# Patient Record
Sex: Female | Born: 1995 | Race: Black or African American | Hispanic: No | Marital: Single | Smoking: Former smoker
Health system: Southern US, Community
[De-identification: ages and names within clinical notes are randomized; demographics above are authoritative.]

## PROBLEM LIST (undated history)

## (undated) ENCOUNTER — Inpatient Hospital Stay (HOSPITAL_COMMUNITY): Payer: Self-pay

## (undated) DIAGNOSIS — J45909 Unspecified asthma, uncomplicated: Secondary | ICD-10-CM

## (undated) DIAGNOSIS — O139 Gestational [pregnancy-induced] hypertension without significant proteinuria, unspecified trimester: Secondary | ICD-10-CM

## (undated) DIAGNOSIS — B999 Unspecified infectious disease: Secondary | ICD-10-CM

## (undated) HISTORY — PX: EYE SURGERY: SHX253

---
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
## 2016-08-14 ENCOUNTER — Encounter (HOSPITAL_COMMUNITY): Payer: Self-pay | Admitting: *Deleted

## 2016-08-14 ENCOUNTER — Inpatient Hospital Stay (HOSPITAL_COMMUNITY)
Admission: AD | Admit: 2016-08-14 | Discharge: 2016-08-14 | Disposition: A | Discharge status: Home / Self Care | Payer: Medicaid Other | Source: Ambulatory Visit | Attending: Obstetrics & Gynecology | Admitting: Obstetrics & Gynecology

## 2016-08-14 DIAGNOSIS — R109 Unspecified abdominal pain: Secondary | ICD-10-CM | POA: Insufficient documentation

## 2016-08-14 DIAGNOSIS — Z3202 Encounter for pregnancy test, result negative: Secondary | ICD-10-CM | POA: Diagnosis not present

## 2016-08-14 DIAGNOSIS — N3000 Acute cystitis without hematuria: Secondary | ICD-10-CM | POA: Insufficient documentation

## 2016-08-14 LAB — POCT PREGNANCY, URINE: Preg Test, Ur: NEGATIVE

## 2016-08-14 LAB — URINALYSIS, ROUTINE W REFLEX MICROSCOPIC
Bilirubin Urine: NEGATIVE
GLUCOSE, UA: NEGATIVE mg/dL
Ketones, ur: NEGATIVE mg/dL
NITRITE: NEGATIVE
PH: 5 (ref 5.0–8.0)
Protein, ur: 30 mg/dL — AB
SPECIFIC GRAVITY, URINE: 1.032 — AB (ref 1.005–1.030)

## 2016-08-14 MED ORDER — CIPROFLOXACIN HCL 500 MG PO TABS: 500.0000 mg | ORAL_TABLET | Freq: Two times a day (BID) | ORAL | 0 refills | Status: DC

## 2016-08-14 NOTE — MAU Provider Note (Signed)
[facility] Attending Note  History     CSN: [id number]  Arrival date & time 08/14/16  1621   None     Chief Complaint  Patient presents with  . Abdominal Pain  . Urinary Frequency  . Headache    Sylvia Chambers is a 21 y.o. G0 who presents to MAU today for evaluation of UTI symptoms. Reports dysuria, frequent urination, suprapubic pain for the last 4 days. Associated with headache.  She is worried she could be pregnant, did not take home UPT.  Denies any abnormal vaginal discharge, fevers, chills, sweats, nausea, vomiting, other GI or GU symptoms or other general symptoms.   Past Medical History:  Diagnosis Date  . Asthma     No past surgical history on file.  No family history on file.  Social History  Substance Use Topics  . Smoking status: Never Smoker  . Smokeless tobacco: Not on file  . Alcohol use No    No Known Allergies  No prescriptions prior to admission.     Physical Exam  BP 137/85 (BP Location: Right Arm)   Pulse 104   Temp 97.7 F (36.5 C) (Oral)   Resp 18   Ht 5\' 3"  (1.6 m)   Wt 183 lb (83 kg)   LMP 07/08/2016   BMI 32.42 kg/m  GENERAL: Well-developed, well-nourished female in no acute distress  SKIN: Warm, dry and without erythema PSYCH: Normal mood and affect HEENT: Normocephalic, atraumatic.   LUNGS: Normal respiratory effort, normal breath sounds HEART: Regular rate noted ABDOMEN: Soft, mild suprapubic tenderness, no rebound, no guarding, nondistended PELVIC: Deferred EXTREMITIES: No edema, no cyanosis, normal range of movement  MAU Course/MDM  UA and UPT done.  Labs and Imaging   Results for orders placed or performed during the hospital encounter of 08/14/16 (from the past 24 hour(s))  Urinalysis, Routine w reflex microscopic     Status: Abnormal   Collection Time: 08/14/16  4:27 PM  Result Value Ref Range   Color, Urine YELLOW YELLOW   APPearance CLOUDY (A) CLEAR   Specific Gravity, Urine 1.032 (H)  1.005 - 1.030   pH 5.0 5.0 - 8.0   Glucose, UA NEGATIVE NEGATIVE mg/dL   Hgb urine dipstick SMALL (A) NEGATIVE   Bilirubin Urine NEGATIVE NEGATIVE   Ketones, ur NEGATIVE NEGATIVE mg/dL   Protein, ur 30 (A) NEGATIVE mg/dL   Nitrite NEGATIVE NEGATIVE   Leukocytes, UA LARGE (A) NEGATIVE   RBC / HPF 0-5 0 - 5 RBC/hpf   WBC, UA TOO NUMEROUS TO COUNT 0 - 5 WBC/hpf   Bacteria, UA RARE (A) NONE SEEN   Squamous Epithelial / LPF 6-30 (A) NONE SEEN   Mucous PRESENT   Pregnancy, urine POC     Status: None   Collection Time: 08/14/16  4:49 PM  Result Value Ref Range   Preg Test, Ur NEGATIVE NEGATIVE   No results found.  Assessment and Plan   1. Acute cystitis without hematuria    Ciprofloxacin ordered given symptoms, will follow up urine culture. She was reassured she was not pregnant, encouraged to go to [facility] to get contraception and to use condoms 100% of the time to prevent STIs.  Was told to return to MAU for any pain, bleeding or other concerns, or if her condition were to change or worsen. Discharged to home in stable condition    Allergies as of 08/14/2016   No Known Allergies     Medication List  TAKE these medications   ciprofloxacin 500 MG tablet Commonly known as:  CIPRO Take 1 tablet (500 mg total) by mouth 2 (two) times daily.   ibuprofen 800 MG tablet Commonly known as:  ADVIL,MOTRIN Take 1 tablet (800 mg total) by mouth 3 (three) times daily.        Brittan Butterbaugh, MD, FACOG Attending Obstetrician & Gynecologist, [facility], [facility]

## 2016-08-14 NOTE — MAU Note (Signed)
Pt C/O lower abd cramping for the last 4 days, also frequent urination.  Took HPT but no lines appeared.  Denies vaginal bleeding.  Also C/O HA.

## 2016-08-14 NOTE — Discharge Instructions (Signed)

## 2017-03-05 ENCOUNTER — Inpatient Hospital Stay (HOSPITAL_COMMUNITY): Payer: Medicaid Other

## 2017-03-05 ENCOUNTER — Inpatient Hospital Stay (HOSPITAL_COMMUNITY)
Admission: AD | Admit: 2017-03-05 | Discharge: 2017-03-05 | Disposition: A | Discharge status: Home / Self Care | Payer: Medicaid Other | Source: Ambulatory Visit | Attending: Obstetrics & Gynecology | Admitting: Obstetrics & Gynecology

## 2017-03-05 ENCOUNTER — Encounter (HOSPITAL_COMMUNITY): Payer: Self-pay | Admitting: *Deleted

## 2017-03-05 DIAGNOSIS — O99511 Diseases of the respiratory system complicating pregnancy, first trimester: Secondary | ICD-10-CM | POA: Diagnosis not present

## 2017-03-05 DIAGNOSIS — O23591 Infection of other part of genital tract in pregnancy, first trimester: Secondary | ICD-10-CM | POA: Insufficient documentation

## 2017-03-05 DIAGNOSIS — O26899 Other specified pregnancy related conditions, unspecified trimester: Secondary | ICD-10-CM

## 2017-03-05 DIAGNOSIS — R109 Unspecified abdominal pain: Secondary | ICD-10-CM | POA: Insufficient documentation

## 2017-03-05 DIAGNOSIS — R1030 Lower abdominal pain, unspecified: Secondary | ICD-10-CM | POA: Diagnosis present

## 2017-03-05 DIAGNOSIS — Z3A01 Less than 8 weeks gestation of pregnancy: Secondary | ICD-10-CM | POA: Insufficient documentation

## 2017-03-05 DIAGNOSIS — Z79899 Other long term (current) drug therapy: Secondary | ICD-10-CM | POA: Insufficient documentation

## 2017-03-05 DIAGNOSIS — O26891 Other specified pregnancy related conditions, first trimester: Secondary | ICD-10-CM | POA: Insufficient documentation

## 2017-03-05 DIAGNOSIS — B9689 Other specified bacterial agents as the cause of diseases classified elsewhere: Secondary | ICD-10-CM

## 2017-03-05 DIAGNOSIS — N760 Acute vaginitis: Secondary | ICD-10-CM | POA: Insufficient documentation

## 2017-03-05 DIAGNOSIS — J45909 Unspecified asthma, uncomplicated: Secondary | ICD-10-CM | POA: Diagnosis not present

## 2017-03-05 DIAGNOSIS — O219 Vomiting of pregnancy, unspecified: Secondary | ICD-10-CM | POA: Insufficient documentation

## 2017-03-05 DIAGNOSIS — Z3491 Encounter for supervision of normal pregnancy, unspecified, first trimester: Secondary | ICD-10-CM

## 2017-03-05 LAB — WET PREP, GENITAL
Sperm: NONE SEEN
Trich, Wet Prep: NONE SEEN
Yeast Wet Prep HPF POC: NONE SEEN

## 2017-03-05 LAB — CBC
HCT: 35.3 % — ABNORMAL LOW (ref 36.0–46.0)
HEMOGLOBIN: 12.2 g/dL (ref 12.0–15.0)
MCH: 29.7 pg (ref 26.0–34.0)
MCHC: 34.6 g/dL (ref 30.0–36.0)
MCV: 85.9 fL (ref 78.0–100.0)
Platelets: 279 10*3/uL (ref 150–400)
RBC: 4.11 MIL/uL (ref 3.87–5.11)
RDW: 12.2 % (ref 11.5–15.5)
WBC: 8 10*3/uL (ref 4.0–10.5)

## 2017-03-05 LAB — URINALYSIS, ROUTINE W REFLEX MICROSCOPIC
Bilirubin Urine: NEGATIVE
Glucose, UA: NEGATIVE mg/dL
HGB URINE DIPSTICK: NEGATIVE
KETONES UR: NEGATIVE mg/dL
NITRITE: NEGATIVE
Protein, ur: NEGATIVE mg/dL
Specific Gravity, Urine: 1.025 (ref 1.005–1.030)
pH: 5 (ref 5.0–8.0)

## 2017-03-05 LAB — HCG, QUANTITATIVE, PREGNANCY: hCG, Beta Chain, Quant, S: 15628 m[IU]/mL — ABNORMAL HIGH (ref <5)

## 2017-03-05 LAB — POCT PREGNANCY, URINE: Preg Test, Ur: POSITIVE — AB

## 2017-03-05 MED ORDER — PROMETHAZINE HCL 25 MG PO TABS: 25.0000 mg | ORAL_TABLET | Freq: Four times a day (QID) | ORAL | 0 refills | Status: DC | PRN

## 2017-03-05 MED ORDER — METRONIDAZOLE 500 MG PO TABS: 500.0000 mg | ORAL_TABLET | Freq: Two times a day (BID) | ORAL | 0 refills | Status: DC

## 2017-03-05 NOTE — MAU Note (Addendum)
Pt presents to MAU c/o lower abdominal pain that started a week ago. Pt states the pain is a 8 out of 10 and explains it as a cramping/stretching pain. Pt states she has been having bad headaches and has had some nausea and vomiting. Pt states she had some coughing  Earlier today and it has now subsided. Pt denies bleeding or vaginal discharge.

## 2017-03-05 NOTE — Discharge Instructions (Signed)
[facility] ° ° °[facility]       Phone: [phone] ° °[facility] Phone: [phone] ° °[facility]  Phone: [phone] ° °[facility] [place]  Phone: [phone] ° °[facility] [place]  Phone: [phone] ° °[facility]       Phone: [phone] ° °[facility]    Phone: [phone]  ° °[facility]    Phone: [phone] ° °[facility]    Phone: [phone] ° °[facility]    Phone: [phone]  ° °[facility]  Phone: [phone] ° °[facility]    Phone: [phone] ° °[facility]    Phone: [phone] ° °[facility]    Phone: [phone] ° ° ° °Bacterial Vaginosis °Bacterial vaginosis is a vaginal infection that occurs when the normal balance of bacteria in the vagina is disrupted. It results from an overgrowth of certain bacteria. This is the most common vaginal infection among women ages 15-44. °Because bacterial vaginosis increases your risk for STIs (sexually transmitted infections), getting treated can help reduce your risk for chlamydia, gonorrhea, herpes, and HIV (human immunodeficiency virus). Treatment is also important for preventing complications in pregnant women, because this condition can cause an early (premature) delivery. °What are the causes? °This condition is caused by an increase in harmful bacteria that are normally present in small amounts in the vagina. However, the reason that the condition develops is not fully understood. °What increases the risk? °The following factors may make you more likely to develop this condition: °· Having a new sexual partner or multiple sexual partners. °· Having unprotected  sex. °· Douching. °· Having an intrauterine device (IUD). °· Smoking. °· Drug and alcohol abuse. °· Taking certain antibiotic medicines. °· Being pregnant. °You cannot get bacterial vaginosis from toilet seats, bedding, swimming pools, or contact with objects around you. °What are the signs or symptoms? °Symptoms of this condition include: °· Grey or white vaginal discharge. The discharge can also be watery or foamy. °· A fish-like odor with discharge, especially after sexual intercourse or during menstruation. °· Itching in and around the vagina. °· Burning or pain with urination. °Some women with bacterial vaginosis have no signs or symptoms. °How is this diagnosed? °This condition is diagnosed based on: °· Your medical history. °· A physical exam of the vagina. °· Testing a sample of vaginal fluid under a microscope to look for a large amount of bad bacteria or abnormal cells. Your health care provider may use a cotton swab or a small wooden spatula to collect the sample. °How is this treated? °This condition is treated with antibiotics. These may be given as a pill, a vaginal cream, or a medicine that is put into the vagina (suppository). If the condition comes back after treatment, a second round of antibiotics may be needed. °Follow these instructions at home: °Medicines  °· Take over-the-counter and prescription medicines only as told by your health care provider. °· Take or use your antibiotic as told by your health care provider. Do not stop taking or using the antibiotic even if you start to feel better. °General instructions  °· If you have a female sexual partner, tell her that you have a vaginal infection. She should see her health care provider and be treated if she has symptoms.   If you have a female sexual partner, he does not need treatment. °· During treatment: °¨ Avoid sexual activity until you finish treatment. °¨ Do not douche. °¨ Avoid alcohol as directed by your health care provider. °¨ Avoid  breastfeeding as directed by your health care provider. °· Drink enough water and fluids to keep your urine clear or pale yellow. °· Keep the area around your vagina and rectum clean. °¨ Wash the area daily with warm water. °¨ Wipe yourself from front to back after using the toilet. °· Keep all follow-up visits as told by your health care provider. This is important. °How is this prevented? °· Do not douche. °· Wash the outside of your vagina with warm water only. °· Use protection when having sex. This includes latex condoms and dental dams. °· Limit how many sexual partners you have. To help prevent bacterial vaginosis, it is best to have sex with just one partner (monogamous). °· Make sure you and your sexual partner are tested for STIs. °· Wear cotton or cotton-lined underwear. °· Avoid wearing tight pants and pantyhose, especially during summer. °· Limit the amount of alcohol that you drink. °· Do not use any products that contain nicotine or tobacco, such as cigarettes and e-cigarettes. If you need help quitting, ask your health care provider. °· Do not use illegal drugs. °Where to find more information: °· Centers for Disease Control and Prevention: www.cdc.gov/std °· American Sexual Health Association (ASHA): www.ashastd.org °· U.S. Department of Health and Human Services, Office on Women's Health: www.womenshealth.gov/ or https://www.womenshealth.gov/a-z-topics/bacterial-vaginosis °Contact a health care provider if: °· Your symptoms do not improve, even after treatment. °· You have more discharge or pain when urinating. °· You have a fever. °· You have pain in your abdomen. °· You have pain during sex. °· You have vaginal bleeding between periods. °Summary °· Bacterial vaginosis is a vaginal infection that occurs when the normal balance of bacteria in the vagina is disrupted. °· Because bacterial vaginosis increases your risk for STIs (sexually transmitted infections), getting treated can help reduce your  risk for chlamydia, gonorrhea, herpes, and HIV (human immunodeficiency virus). Treatment is also important for preventing complications in pregnant women, because the condition can cause an early (premature) delivery. °· This condition is treated with antibiotic medicines. These may be given as a pill, a vaginal cream, or a medicine that is put into the vagina (suppository). °This information is not intended to replace advice given to you by your health care provider. Make sure you discuss any questions you have with your health care provider. °Document Released: 05/20/2005 Document Revised: 02/03/2016 Document Reviewed: 02/03/2016 °Elsevier Interactive Patient Education © 2017 Elsevier Inc. ° °

## 2017-03-05 NOTE — MAU Provider Note (Signed)
History     CSN: [id number]  Arrival date and time: 03/05/17 2035  First Provider Initiated Contact with Patient 03/05/17 2123      Chief Complaint  Patient presents with  . Abdominal Pain   HPI Sylvia Chambers is a 21 y.o. G1P0 at [redacted]w[redacted]d by LMP who presents with abdominal pain. Symptoms began on Monday. Reports intermittent lower abdominal pain that she rates 8/10 & describes as cramping. Has taken ibuprofen without relief. Endorses some nausea, no vomiting today. Denies dysuria, vaginal discharge, fever/chills, diarrhea/constipation, or vaginal bleeding. LMP was 01/27/17. Did not take pregnancy test at home.   OB History    Gravida Para Term Preterm AB Living   1             SAB TAB Ectopic Multiple Live Births                  Past Medical History:  Diagnosis Date  . Asthma     No past surgical history on file.  No family history on file.  Social History  Substance Use Topics  . Smoking status: Never Smoker  . Smokeless tobacco: Not on file  . Alcohol use No    Allergies: No Known Allergies  Prescriptions Prior to Admission  Medication Sig Dispense Refill Last Dose  . ciprofloxacin (CIPRO) 500 MG tablet Take 1 tablet (500 mg total) by mouth 2 (two) times daily. 6 tablet 0   . ibuprofen (ADVIL,MOTRIN) 800 MG tablet Take 1 tablet (800 mg total) by mouth 3 (three) times daily. 21 tablet 0 Unknown at Unknown time    Review of Systems  Constitutional: Negative.   Gastrointestinal: Positive for abdominal pain and nausea. Negative for constipation, diarrhea and vomiting.  Genitourinary: Negative.    Physical Exam   Blood pressure 129/83, pulse 97, temperature 99.6 F (37.6 C), temperature source Oral, resp. rate 17, height  (1.6 m), last menstrual period 01/27/2017.  Physical Exam  Nursing note and vitals reviewed. Constitutional: She is oriented to person, place, and time. She appears well-developed and well-nourished. No distress.  HENT:  Head:  Normocephalic and atraumatic.  Eyes: Conjunctivae are normal. Right eye exhibits no discharge. Left eye exhibits no discharge. No scleral icterus.  Neck: Normal range of motion.  Cardiovascular: Normal rate, regular rhythm and normal heart sounds.   No murmur heard. Respiratory: Effort normal and breath sounds normal. No respiratory distress. She has no wheezes.  GI: Soft. Bowel sounds are normal. She exhibits no distension. There is no tenderness. There is no rebound and no guarding.  Genitourinary: Uterus normal. Cervix exhibits no motion tenderness. Right adnexum displays no mass and no tenderness. Left adnexum displays no mass and no tenderness.  Genitourinary Comments: Cervix closed  Neurological: She is alert and oriented to person, place, and time.  Skin: Skin is warm and dry. She is not diaphoretic.  Psychiatric: She has a normal mood and affect. Her behavior is normal. Judgment and thought content normal.    MAU Course  Procedures Results for orders placed or performed during the hospital encounter of 03/05/17 (from the past 24 hour(s))  Urinalysis, Routine w reflex microscopic     Status: Abnormal   Collection Time: 03/05/17  8:59 PM  Result Value Ref Range   Color, Urine YELLOW YELLOW   APPearance HAZY (A) CLEAR   Specific Gravity, Urine 1.025 1.005 - 1.030   pH 5.0 5.0 - 8.0   Glucose, UA NEGATIVE NEGATIVE mg/dL   Hgb urine  dipstick NEGATIVE NEGATIVE   Bilirubin Urine NEGATIVE NEGATIVE   Ketones, ur NEGATIVE NEGATIVE mg/dL   Protein, ur NEGATIVE NEGATIVE mg/dL   Nitrite NEGATIVE NEGATIVE   Leukocytes, UA LARGE (A) NEGATIVE   RBC / HPF 0-5 0 - 5 RBC/hpf   WBC, UA 6-30 0 - 5 WBC/hpf   Bacteria, UA RARE (A) NONE SEEN   Squamous Epithelial / LPF 6-30 (A) NONE SEEN   Mucus PRESENT   Pregnancy, urine POC     Status: Abnormal   Collection Time: 03/05/17  9:05 PM  Result Value Ref Range   Preg Test, Ur POSITIVE (A) NEGATIVE  Wet prep, genital     Status: Abnormal    Collection Time: 03/05/17  9:31 PM  Result Value Ref Range   Yeast Wet Prep HPF POC NONE SEEN NONE SEEN   Trich, Wet Prep NONE SEEN NONE SEEN   Clue Cells Wet Prep HPF POC PRESENT (A) NONE SEEN   WBC, Wet Prep HPF POC MODERATE (A) NONE SEEN   Sperm NONE SEEN   CBC     Status: Abnormal   Collection Time: 03/05/17 10:00 PM  Result Value Ref Range   WBC 8.0 4.0 - 10.5 K/uL   RBC 4.11 3.87 - 5.11 MIL/uL   Hemoglobin 12.2 12.0 - 15.0 g/dL   HCT 09.8 (L) 11.9 - 14.7 %   MCV 85.9 78.0 - 100.0 fL   MCH 29.7 26.0 - 34.0 pg   MCHC 34.6 30.0 - 36.0 g/dL   RDW 82.9 56.2 - 13.0 %   Platelets 279 150 - 400 K/uL  ABO/Rh     Status: None (Preliminary result)   Collection Time: 03/05/17 10:00 PM  Result Value Ref Range   ABO/RH(D) B POS   hCG, quantitative, pregnancy     Status: Abnormal   Collection Time: 03/05/17 10:00 PM  Result Value Ref Range   hCG, Beta Chain, Quant, S 15,628 (H) <5 mIU/mL   US Ob Comp Less 14 Wks  Result Date: 03/05/2017 CLINICAL DATA:  Lower abdominal pain for 1 week. Positive urine pregnancy test. Gestational age by ultrasound 5 weeks and 2 days. EXAM: OBSTETRIC <14 WK [place] AND TRANSVAGINAL OB US TECHNIQUE: Both transabdominal and transvaginal ultrasound examinations were performed for complete evaluation of the gestation as well as the maternal uterus, adnexal regions, and pelvic cul-de-sac. Transvaginal technique was performed to assess early pregnancy. COMPARISON:  None. FINDINGS: Intrauterine gestational sac: Present Yolk sac:  Present Embryo:  Not press Cardiac Activity: Not present MSD: 12  mm   6 w   0  d Subchorionic hemorrhage:  None visualized. Maternal uterus/adnexae: 2.3 cm ovarian RIGHT corpus luteal cyst. Trace free fluid. IMPRESSION: Probable early intrauterine gestational and yolk sac, without fetal pole, or cardiac activity yet visualized. Recommend follow-up quantitative B-HCG levels and follow-up US in 14 days to assess viability. This recommendation follows  SRU consensus guidelines: Diagnostic Criteria for Nonviable Pregnancy Early in the First Trimester. Malva Limes Med 2013; 865:7846-96. Electronically Signed   By: Awilda Metro M.D.   On: 03/05/2017 22:24   US Ob Transvaginal  Result Date: 03/05/2017 CLINICAL DATA:  Lower abdominal pain for 1 week. Positive urine pregnancy test. Gestational age by ultrasound 5 weeks and 2 days. EXAM: OBSTETRIC <14 WK [place] AND TRANSVAGINAL OB US TECHNIQUE: Both transabdominal and transvaginal ultrasound examinations were performed for complete evaluation of the gestation as well as the maternal uterus, adnexal regions, and pelvic cul-de-sac. Transvaginal technique was performed to  assess early pregnancy. COMPARISON:  None. FINDINGS: Intrauterine gestational sac: Present Yolk sac:  Present Embryo:  Not press Cardiac Activity: Not present MSD: 12  mm   6 w   0  d Subchorionic hemorrhage:  None visualized. Maternal uterus/adnexae: 2.3 cm ovarian RIGHT corpus luteal cyst. Trace free fluid. IMPRESSION: Probable early intrauterine gestational and yolk sac, without fetal pole, or cardiac activity yet visualized. Recommend follow-up quantitative B-HCG levels and follow-up US in 14 days to assess viability. This recommendation follows SRU consensus guidelines: Diagnostic Criteria for Nonviable Pregnancy Early in the First Trimester. Malva Limes Med 2013; 161:0960-45. Electronically Signed   By: Awilda Metro M.D.   On: 03/05/2017 22:24     MDM +UPT UA, wet prep, GC/chlamydia, CBC, ABO/Rh, quant hCG, HIV, and [place] today to rule out ectopic pregnancy B positive Ultrasound shows IUGS with yolk sac Assessment and Plan  A: 1. Normal IUP (intrauterine pregnancy) on prenatal ultrasound, first trimester   2. Abdominal pain affecting pregnancy   3. Nausea and vomiting during pregnancy prior to [redacted] weeks gestation   4. BV (bacterial vaginosis)   5. Less than [redacted] weeks gestation of pregnancy    P: Discharge home Rx flagyl Discussed  reasons to return to MAU Start prenatal care GC/CT pending;  Judeth Horn 03/05/2017, 9:23 PM

## 2017-03-06 LAB — ABO/RH: ABO/RH(D): B POS

## 2017-03-06 LAB — GC/CHLAMYDIA PROBE AMP (~~LOC~~) NOT AT ARMC
Chlamydia: NEGATIVE
Neisseria Gonorrhea: NEGATIVE

## 2017-03-28 ENCOUNTER — Inpatient Hospital Stay (HOSPITAL_COMMUNITY)
Admission: AD | Admit: 2017-03-28 | Discharge: 2017-03-28 | Disposition: A | Discharge status: Home / Self Care | Payer: Medicaid Other | Source: Ambulatory Visit | Attending: Obstetrics & Gynecology | Admitting: Obstetrics & Gynecology

## 2017-03-28 ENCOUNTER — Encounter (HOSPITAL_COMMUNITY): Payer: Self-pay | Admitting: *Deleted

## 2017-03-28 ENCOUNTER — Inpatient Hospital Stay (HOSPITAL_COMMUNITY): Payer: Medicaid Other

## 2017-03-28 DIAGNOSIS — R109 Unspecified abdominal pain: Secondary | ICD-10-CM

## 2017-03-28 DIAGNOSIS — O219 Vomiting of pregnancy, unspecified: Secondary | ICD-10-CM | POA: Diagnosis not present

## 2017-03-28 DIAGNOSIS — E860 Dehydration: Secondary | ICD-10-CM | POA: Diagnosis not present

## 2017-03-28 DIAGNOSIS — Z3A08 8 weeks gestation of pregnancy: Secondary | ICD-10-CM | POA: Diagnosis not present

## 2017-03-28 DIAGNOSIS — O26891 Other specified pregnancy related conditions, first trimester: Secondary | ICD-10-CM | POA: Diagnosis not present

## 2017-03-28 DIAGNOSIS — R51 Headache: Secondary | ICD-10-CM | POA: Diagnosis not present

## 2017-03-28 DIAGNOSIS — G44209 Tension-type headache, unspecified, not intractable: Secondary | ICD-10-CM

## 2017-03-28 DIAGNOSIS — O26899 Other specified pregnancy related conditions, unspecified trimester: Secondary | ICD-10-CM

## 2017-03-28 LAB — URINALYSIS, ROUTINE W REFLEX MICROSCOPIC
BILIRUBIN URINE: NEGATIVE
GLUCOSE, UA: NEGATIVE mg/dL
Hgb urine dipstick: NEGATIVE
KETONES UR: NEGATIVE mg/dL
NITRITE: NEGATIVE
PH: 5 (ref 5.0–8.0)
Protein, ur: 30 mg/dL — AB
SPECIFIC GRAVITY, URINE: 1.028 (ref 1.005–1.030)

## 2017-03-28 MED ORDER — DIPHENHYDRAMINE HCL 50 MG/ML IJ SOLN
25.0000 mg | Freq: Once | INTRAMUSCULAR | Status: AC
  Administered 2017-03-28: 25 mg via INTRAVENOUS
  Filled 2017-03-28: qty 1

## 2017-03-28 MED ORDER — METOCLOPRAMIDE HCL 5 MG/ML IJ SOLN
10.0000 mg | Freq: Once | INTRAMUSCULAR | Status: AC
  Administered 2017-03-28: 10 mg via INTRAVENOUS
  Filled 2017-03-28: qty 2

## 2017-03-28 MED ORDER — DEXAMETHASONE SODIUM PHOSPHATE 10 MG/ML IJ SOLN
10.0000 mg | Freq: Once | INTRAMUSCULAR | Status: AC
  Administered 2017-03-28: 10 mg via INTRAVENOUS
  Filled 2017-03-28: qty 1

## 2017-03-28 MED ORDER — SODIUM CHLORIDE 0.9 % IV BOLUS (SEPSIS)
1000.0000 mL | Freq: Once | INTRAVENOUS | Status: AC
  Administered 2017-03-28: 1000 mL via INTRAVENOUS

## 2017-03-28 MED ORDER — METOCLOPRAMIDE HCL 10 MG PO TABS: 10.0000 mg | ORAL_TABLET | Freq: Three times a day (TID) | ORAL | 0 refills | Status: DC | PRN

## 2017-03-28 NOTE — MAU Note (Addendum)
Having pain in lower abd for 3 wks and headaches same amt of time. Tylenol not helping headaches.Denies vag bleeding or d/c. Unable to keep down foods but able to keep down liqs. Phenergan not helping

## 2017-03-28 NOTE — Discharge Instructions (Signed)
Eating Plan for Nausea and Vomiting in pregnancy Hyperemesis gravidarum is a severe form of morning sickness. Because this condition causes severe nausea and vomiting, it can lead to dehydration, malnutrition, and weight loss. One way to lessen the symptoms of nausea and vomiting is to follow the eating plan for hyperemesis gravidarum. It is often used along with prescribed medicines to control your symptoms. What can I do to relieve my symptoms? Listen to your body. Everyone is different and has different preferences. Find what works best for you. Take any of the following actions that are helpful to you:  Eat and drink slowly.  Eat 5-6 small meals daily instead of 3 large meals.  Eat crackers before you get out of bed in the morning.  Try having a snack in the middle of the night.  Starchy foods are usually tolerated well. Examples include cereal, toast, bread, potatoes, pasta, rice, and pretzels.  Ginger may help with nausea. Add  tsp ground ginger to hot tea or choose ginger tea.  Try drinking 100% fruit juice or an electrolyte drink. An electrolyte drink contains sodium, potassium, and chloride.  Continue to take your prenatal vitamins as told by your health care provider. If you are having trouble taking your prenatal vitamins, talk with your health care provider about different options.  Include at least 1 serving of protein with your meals and snacks. Protein options include meats or poultry, beans, nuts, eggs, and yogurt. Try eating a protein-rich snack before bed. Examples of these snacks include cheese and crackers or half of a peanut butter or [place] sandwich.  Consider eliminating foods that trigger your symptoms. These may include spicy foods, coffee, high-fat foods, very sweet foods, and acidic foods.  Try meals that have more protein combined with bland, salty, lower-fat, and dry foods, such as nuts, seeds, pretzels, crackers, and cereal.  Talk with your healthcare  provider about starting a supplement of vitamin B6.  Have fluids that are cold, clear, and carbonated or sour. Examples include lemonade, ginger ale, lemon-lime soda, ice water, and sparkling water.  Try lemon or mint tea.  Try brushing your teeth or using a mouth rinse after meals.  What should I avoid to reduce my symptoms? Avoiding some of the following things may help reduce your symptoms.  Foods with strong smells. Try eating meals in well-ventilated areas that are free of odors.  Drinking water or other beverages with meals. Try not to drink anything during the 30 minutes before and after your meals.  Drinking more than 1 cup of fluid at a time. Sometimes using a straw helps.  Fried or high-fat foods, such as butter and cream sauces.  Spicy foods.  Skipping meals as best as you can. Nausea can be more intense on an empty stomach. If you cannot tolerate food at that time, do not force it. Try sucking on ice chips or other frozen items, and make up for missed calories later.  Lying down within 2 hours after eating.  Environmental triggers. These may include smoky rooms, closed spaces, rooms with strong smells, warm or humid places, overly loud and noisy rooms, and rooms with motion or flickering lights.  Quick and sudden changes in your movement.  This information is not intended to replace advice given to you by your health care provider. Make sure you discuss any questions you have with your health care provider. Document Released: 03/17/2007 Document Revised: 01/17/2016 Document Reviewed: 12/19/2015 Elsevier Interactive Patient Education  2018 [employer]. How  a Baby Grows During Pregnancy Pregnancy begins when a female's sperm enters a female's egg (fertilization). This happens in one of the tubes (fallopian tubes) that connect the ovaries to the womb (uterus). The fertilized egg is called an embryo until it reaches 10 weeks. From 10 weeks until birth, it is called a fetus.  The fertilized egg moves down the fallopian tube to the uterus. Then it implants into the lining of the uterus and begins to grow. The developing fetus receives oxygen and nutrients through the pregnant woman's bloodstream and the tissues that grow (placenta) to support the fetus. The placenta is the life support system for the fetus. It provides nutrition and removes waste. Learning as much as you can about your pregnancy and how your baby is developing can help you enjoy the experience. It can also make you aware of when there might be a problem and when to ask questions. How long does a typical pregnancy last? A pregnancy usually lasts 280 days, or about 40 weeks. Pregnancy is divided into three trimesters:  First trimester: 0-13 weeks.  Second trimester: 14-27 weeks.  Third trimester: 28-40 weeks.  The day when your baby is considered ready to be born (full term) is your estimated date of delivery. How does my baby develop month by month? First month  The fertilized egg attaches to the inside of the uterus.  Some cells will form the placenta. Others will form the fetus.  The arms, legs, brain, spinal cord, lungs, and heart begin to develop.  At the end of the first month, the heart begins to beat.  Second month  The bones, inner ear, eyelids, hands, and feet form.  The genitals develop.  By the end of 8 weeks, all major organs are developing.  Third month  All of the internal organs are forming.  Teeth develop below the gums.  Bones and muscles begin to grow. The spine can flex.  The skin is transparent.  Fingernails and toenails begin to form.  Arms and legs continue to grow longer, and hands and feet develop.  The fetus is about 3 in (7.6 cm) long.  Fourth month  The placenta is completely formed.  The external sex organs, neck, outer ear, eyebrows, eyelids, and fingernails are formed.  The fetus can hear, swallow, and move its arms and legs.  The kidneys  begin to produce urine.  The skin is covered with a white waxy coating (vernix) and very fine hair (lanugo).  Fifth month  The fetus moves around more and can be felt for the first time (quickening).  The fetus starts to sleep and wake up and may begin to suck its finger.  The nails grow to the end of the fingers.  The organ in the digestive system that makes bile (gallbladder) functions and helps to digest the nutrients.  If your baby is a girl, eggs are present in her ovaries. If your baby is a boy, testicles start to move down into his scrotum.  Sixth month  The lungs are formed, but the fetus is not yet able to breathe.  The eyes open. The brain continues to develop.  Your baby has fingerprints and toe prints. Your baby's hair grows thicker.  At the end of the second trimester, the fetus is about 9 in (22.9 cm) long.  Seventh month  The fetus kicks and stretches.  The eyes are developed enough to sense changes in light.  The hands can make a grasping motion.  The  fetus responds to sound.  Eighth month  All organs and body systems are fully developed and functioning.  Bones harden and taste buds develop. The fetus may hiccup.  Certain areas of the brain are still developing. The skull remains soft.  Ninth month  The fetus gains about  lb (0.23 kg) each week.  The lungs are fully developed.  Patterns of sleep develop.  The fetus's head typically moves into a head-down position (vertex) in the uterus to prepare for birth. If the buttocks move into a vertex position instead, the baby is breech.  The fetus weighs 6-9 lbs (2.72-4.08 kg) and is 19-20 in (48.26-50.8 cm) long.  What can I do to have a healthy pregnancy and help my baby develop? Eating and Drinking  Eat a healthy diet. ? Talk with your health care provider to make sure that you are getting the nutrients that you and your baby need. ? Visit www.[employer].be to learn about creating a healthy  diet.  Gain a healthy amount of weight during pregnancy as advised by your health care provider. This is usually 25-35 pounds. You may need to: ? Gain more if you were underweight before getting pregnant or if you are pregnant with more than one baby. ? Gain less if you were overweight or obese when you got pregnant.  Medicines and Vitamins  Take prenatal vitamins as directed by your health care provider. These include vitamins such as folic acid, iron, calcium, and vitamin D. They are important for healthy development.  Take medicines only as directed by your health care provider. Read labels and ask a pharmacist or your health care provider whether over-the-counter medicines, supplements, and prescription drugs are safe to take during pregnancy.  Activities  Be physically active as advised by your health care provider. Ask your health care provider to recommend activities that are safe for you to do, such as walking or swimming.  Do not participate in strenuous or extreme sports.  Lifestyle  Do not drink alcohol.  Do not use any tobacco products, including cigarettes, chewing tobacco, or electronic cigarettes. If you need help quitting, ask your health care provider.  Do not use illegal drugs.  Safety  Avoid exposure to mercury, lead, or other heavy metals. Ask your health care provider about common sources of these heavy metals.  Avoid listeria infection during pregnancy. Follow these precautions: ? Do not eat soft cheeses or deli meats. ? Do not eat hot dogs unless they have been warmed up to the point of steaming, such as in the microwave oven. ? Do not drink unpasteurized milk.  Avoid toxoplasmosis infection during pregnancy. Follow these precautions: ? Do not change your cat's litter box, if you have a cat. Ask someone else to do this for you. ? Wear gardening gloves while working in the yard.  General Instructions  Keep all follow-up visits as directed by your health  care provider. This is important. This includes prenatal care and screening tests.  Manage any chronic health conditions. Work closely with your health care provider to keep conditions, such as diabetes, under control.  How do I know if my baby is developing well? At each prenatal visit, your health care provider will do several different tests to check on your health and keep track of your babys development. These include:  Fundal height. ? Your health care provider will measure your growing belly from top to bottom using a tape measure. ? Your health care provider will also feel your belly  to determine your baby's position. °· Heartbeat. °? An ultrasound in the first trimester can confirm pregnancy and show a heartbeat, depending on how far along you are. °? Your health care provider will check your baby's heart rate at every prenatal visit. °? As you get closer to your delivery date, you may have regular fetal heart rate monitoring to make sure that your baby is not in distress. °· Second trimester ultrasound. °? This ultrasound checks your baby's development. It also indicates your baby’s gender. ° °What should I do if I have concerns about my baby's development? °Always talk with your health care provider about any concerns that you may have. °This information is not intended to replace advice given to you by your health care provider. Make sure you discuss any questions you have with your health care provider. °Document Released: 11/06/2007 Document Revised: 10/26/2015 Document Reviewed: 10/27/2013 °Elsevier Interactive Patient Education © 2018 Elsevier Inc. ° °

## 2017-03-28 NOTE — MAU Provider Note (Signed)
History     CSN: [id number]  Arrival date and time: 03/28/17 [id number]   First Provider Initiated Contact with Patient 03/28/17 2005      Chief Complaint  Patient presents with  . Abdominal Pain  . Headache   HPI Ms. Sylvia Rockne MenghiniJ Galindez is a 21 y.o. G1P0 at 6522w4d who presents to MAU today with complaint of N/V, abdominal pain and headache. The patient was seen in MAU ~ 3 weeks ago. She was given Phenergan. She has been taking that as needed, but doesn't feel it is working. She has not had any vomiting today. She has been able to eat and drink today. She did have multiple episodes of emesis yesterday. She has had a headache x 3 weeks. She rates her pain at 10/10 now. She has also been having lower abdominal cramping for the last few weeks. She rates this pain at 10/10 as well. She denies vaginal bleeding, discharge, UTI symptoms or fever. She has had watery stools at times, but no stools today. She was treated for BV at her last visit and completed the course.     OB History    Gravida Para Term Preterm AB Living   1             SAB TAB Ectopic Multiple Live Births                  Past Medical History:  Diagnosis Date  . Asthma     History reviewed. No pertinent surgical history.  History reviewed. No pertinent family history.  Social History  Substance Use Topics  . Smoking status: Never Smoker  . Smokeless tobacco: Never Used  . Alcohol use No    Allergies: No Known Allergies  No prescriptions prior to admission.    Review of Systems  Constitutional: Negative for fever.  Gastrointestinal: Positive for abdominal pain, nausea and vomiting. Negative for constipation and diarrhea.  Genitourinary: Negative for dysuria, frequency, urgency, vaginal bleeding and vaginal discharge.   Physical Exam   Blood pressure 134/75, pulse 77, temperature 98.4 F (36.9 C), resp. rate 18, height 5\' 3"  (1.6 m), weight 169 lb (76.7 kg), last menstrual period 01/27/2017.  Physical Exam   Nursing note and vitals reviewed. Constitutional: She is oriented to person, place, and time. She appears well-developed and well-nourished. No distress.  Appears comfortable and is on her phone and laughing with a friend  HENT:  Head: Normocephalic and atraumatic.  Cardiovascular: Normal rate, regular rhythm and normal heart sounds.   Respiratory: Effort normal and breath sounds normal. No respiratory distress.  GI: Soft. Bowel sounds are normal. She exhibits no distension and no mass. There is no tenderness. There is no rebound and no guarding.  Neurological: She is alert and oriented to person, place, and time.  Skin: Skin is warm and dry. No erythema.  Psychiatric: She has a normal mood and affect.    Results for orders placed or performed during the hospital encounter of 03/28/17 (from the past 24 hour(s))  Urinalysis, Routine w reflex microscopic     Status: Abnormal   Collection Time: 03/28/17  7:34 PM  Result Value Ref Range   Color, Urine YELLOW YELLOW   APPearance CLOUDY (A) CLEAR   Specific Gravity, Urine 1.028 1.005 - 1.030   pH 5.0 5.0 - 8.0   Glucose, UA NEGATIVE NEGATIVE mg/dL   Hgb urine dipstick NEGATIVE NEGATIVE   Bilirubin Urine NEGATIVE NEGATIVE   Ketones, ur NEGATIVE NEGATIVE mg/dL  Protein, ur 30 (A) NEGATIVE mg/dL   Nitrite NEGATIVE NEGATIVE   Leukocytes, UA LARGE (A) NEGATIVE   RBC / HPF 0-5 0 - 5 RBC/hpf   WBC, UA TOO NUMEROUS TO COUNT 0 - 5 WBC/hpf   Bacteria, UA RARE (A) NONE SEEN   Squamous Epithelial / LPF 6-30 (A) NONE SEEN   Mucus PRESENT    US Ob Transvaginal  Result Date: 03/28/2017 CLINICAL DATA:  Lower abdominal pain 3 weeks. Follow-up from 03/05/2017. Estimated gestational age per LMP 8 weeks 5 days. Quantitative beta HCG not performed. EXAM: OBSTETRIC <14 WK [place] AND TRANSVAGINAL OB US TECHNIQUE: Both transabdominal and transvaginal ultrasound examinations were performed for complete evaluation of the gestation as well as the maternal uterus,  adnexal regions, and pelvic cul-de-sac. Transvaginal technique was performed to assess early pregnancy. COMPARISON:  03/05/2017 FINDINGS: Intrauterine gestational sac: Single visualized. Yolk sac:  Visualized. Embryo:  Visualized. Cardiac Activity: Visualized. Heart Rate: 177  bpm CRL:  22  mm   8 w   6 d                  [place] EDC: 11/01/2017 Subchorionic hemorrhage: Small subchorionic hemorrhage measuring 0.7 x 0.8 x 3.1 cm. Maternal uterus/adnexae: Ovaries within normal. No free pelvic fluid. IMPRESSION: Single live IUP with estimated gestational age [redacted] weeks 6 days. Small subchronic hemorrhage. Recommend follow-up ultrasound as clinically indicated. Electronically Signed   By: Elberta Fortis M.D.   On: 03/28/2017 21:34     MAU Course  Procedures Bedside US attempted, but unable to visualize IUP  MDM UA today  Mild dehydration noted IV fluids, Reglan, Decadron and Benadryl given for headache and nausea US OB transvaginal ordered - SIUP noted with normal FHR Patient reports resolution of headache and nausea at time of discharge.   Assessment and Plan  A: SIUP at [redacted]w[redacted]d Nausea and vomiting in pregnancy prior to [redacted] weeks gestation  Headache  Abdominal pain in pregnancy, first trimester  P: Discharge home Rx for Reglan given to patient  Patient may also continue Phenergan when not taking Reglan Continue Tylenol PRN for pain  First trimester precautions discussed Patient advised to follow-up with [facility] as planned to start prenatal care Patient may return to MAU as needed or if her condition were to change or worsen   Vonzella Nipple, PA-C 03/28/2017, 11:14 PM

## 2017-04-23 LAB — OB RESULTS CONSOLE HIV ANTIBODY (ROUTINE TESTING): HIV: NONREACTIVE

## 2017-04-23 LAB — OB RESULTS CONSOLE GC/CHLAMYDIA
Chlamydia: NEGATIVE
GC PROBE AMP, GENITAL: NEGATIVE

## 2017-04-23 LAB — OB RESULTS CONSOLE RUBELLA ANTIBODY, IGM: RUBELLA: IMMUNE

## 2017-04-23 LAB — OB RESULTS CONSOLE HEPATITIS B SURFACE ANTIGEN: HEP B S AG: NEGATIVE

## 2017-04-23 LAB — OB RESULTS CONSOLE RPR: RPR: NONREACTIVE

## 2017-06-03 NOTE — L&D Delivery Note (Signed)
Delivery Note At 4:43 PM a viable and healthy female was delivered via Vaginal, Spontaneous (Presentation: cephalic).  APGAR: 9, 9; weight  pending.   Placenta status: spont, intact.  Cord: 3V with the following complications: none.  Cord pH: n/a  Anesthesia:  Epidural Episiotomy:  n/a Lacerations: 1st degree vaginal and bilateral labial Suture Repair: 3.0 vicryl Est. Blood Loss (mL): 300  Mom to postpartum.  Baby to Couplet care / Skin to Skin.  Purcell Nails 10/17/2017, 5:06 PM

## 2017-09-18 ENCOUNTER — Ambulatory Visit (HOSPITAL_COMMUNITY)
Admission: EM | Admit: 2017-09-18 | Discharge: 2017-09-18 | Disposition: A | Discharge status: Home / Self Care | Payer: Medicaid Other | Attending: Family Medicine | Admitting: Family Medicine

## 2017-09-18 ENCOUNTER — Other Ambulatory Visit: Payer: Self-pay

## 2017-09-18 ENCOUNTER — Encounter (HOSPITAL_COMMUNITY): Payer: Self-pay | Admitting: Emergency Medicine

## 2017-09-18 DIAGNOSIS — H65191 Other acute nonsuppurative otitis media, right ear: Secondary | ICD-10-CM | POA: Diagnosis not present

## 2017-09-18 DIAGNOSIS — H66002 Acute suppurative otitis media without spontaneous rupture of ear drum, left ear: Secondary | ICD-10-CM

## 2017-09-18 MED ORDER — AMOXICILLIN 500 MG PO CAPS: 500.0000 mg | ORAL_CAPSULE | Freq: Three times a day (TID) | ORAL | 0 refills | Status: AC

## 2017-09-18 NOTE — ED Triage Notes (Signed)
Seen by provider

## 2017-09-18 NOTE — ED Provider Notes (Signed)
[facility]    CSN: [id number] Arrival date & time: 09/18/17  1641     History   Chief Complaint No chief complaint on file.   HPI Gailene Rockne MenghiniJ Rucci is a 22 y.o. female.   22 year old female, with history of asthma, currently approximately 8 months gestation, presenting today complaining of congestion and left ear pain.  Symptoms started about a week ago.  Have been gradually worsening since the onset.  Mother is also here being evaluated for the same symptoms.  Patient said no fever chills, sore throat, headache, neck pain or stiffness.  The history is provided by the patient.  Otalgia  Location:  Left Quality:  Aching Severity:  Mild Onset quality:  Gradual Duration:  1 week Progression:  Waxing and waning Chronicity:  New Context: recent URI   Context: not direct blow, not elevation change, not foreign body in ear and not loud noise   Relieved by:  Nothing Worsened by:  Nothing Ineffective treatments:  None tried Associated symptoms: congestion and cough   Associated symptoms: no abdominal pain, no diarrhea, no ear discharge, no fever, no headaches, no hearing loss, no neck pain, no rash, no rhinorrhea, no sore throat, no tinnitus and no vomiting   Risk factors: no recent travel, no chronic ear infection and no prior ear surgery     Past Medical History:  Diagnosis Date  . Asthma     There are no active problems to display for this patient.   No past surgical history on file.  OB History    Gravida  1   Para      Term      Preterm      AB      Living        SAB      TAB      Ectopic      Multiple      Live Births               Home Medications    Prior to Admission medications   Medication Sig Start Date End Date Taking? Authorizing Provider  amoxicillin (AMOXIL) 500 MG capsule Take 1 capsule (500 mg total) by mouth 3 (three) times daily for 10 days. 09/18/17 09/28/17  Kadija Cruzen C, PA-C  metoCLOPramide (REGLAN) 10 MG  tablet Take 1 tablet (10 mg total) by mouth every 8 (eight) hours as needed for nausea. 03/28/17   Marny LowensteinWenzel, Julie N, PA-C  promethazine (PHENERGAN) 25 MG tablet Take 1 tablet (25 mg total) by mouth every 6 (six) hours as needed for nausea or vomiting. 03/05/17   Judeth HornLawrence, Erin, NP    Family History No family history on file.  Social History Social History   Tobacco Use  . Smoking status: Never Smoker  . Smokeless tobacco: Never Used  Substance Use Topics  . Alcohol use: No  . Drug use: Not on file     Allergies   Patient has no known allergies.   Review of Systems Review of Systems  Constitutional: Negative for chills and fever.  HENT: Positive for congestion and ear pain. Negative for ear discharge, hearing loss, rhinorrhea, sore throat and tinnitus.   Eyes: Negative for pain and visual disturbance.  Respiratory: Positive for cough. Negative for shortness of breath.   Cardiovascular: Negative for chest pain and palpitations.  Gastrointestinal: Negative for abdominal pain, diarrhea and vomiting.  Genitourinary: Negative for dysuria and hematuria.  Musculoskeletal: Negative for arthralgias, back pain and neck  pain.  Skin: Negative for color change and rash.  Neurological: Negative for seizures, syncope and headaches.  All other systems reviewed and are negative.    Physical Exam Triage Vital Signs ED Triage Vitals  Enc Vitals Group     BP      Pulse      Resp      Temp      Temp src      SpO2      Weight      Height      Head Circumference      Peak Flow      Pain Score      Pain Loc      Pain Edu?      Excl. in GC?    No data found.  Updated Vital Signs LMP 01/27/2017 (Exact Date)   Visual Acuity Right Eye Distance:   Left Eye Distance:   Bilateral Distance:    Right Eye Near:   Left Eye Near:    Bilateral Near:     Physical Exam  Constitutional: She appears well-developed and well-nourished. No distress.  HENT:  Head: Normocephalic and  atraumatic.  Right Ear: Hearing, external ear and ear canal normal. A middle ear effusion is present.  Left Ear: Hearing, external ear and ear canal normal. Tympanic membrane is erythematous and bulging.  Nose: Nose normal.  Mouth/Throat: Oropharynx is clear and moist. No oropharyngeal exudate, posterior oropharyngeal edema, posterior oropharyngeal erythema or tonsillar abscesses.  Eyes: Conjunctivae are normal.  Neck: Neck supple.  Cardiovascular: Normal rate and regular rhythm.  No murmur heard. Pulmonary/Chest: Effort normal and breath sounds normal. No stridor. No respiratory distress. She has no wheezes. She has no rhonchi. She has no rales.  Abdominal: Soft. There is no tenderness.  Musculoskeletal: She exhibits no edema.  Neurological: She is alert.  Skin: Skin is warm and dry.  Psychiatric: She has a normal mood and affect.  Nursing note and vitals reviewed.    UC Treatments / Results  Labs (all labs ordered are listed, but only abnormal results are displayed) Labs Reviewed - No data to display  EKG None Radiology No results found.  Procedures Procedures (including critical care time)  Medications Ordered in UC Medications - No data to display   Initial Impression / Assessment and Plan / UC Course  I have reviewed the triage vital signs and the nursing notes.  Pertinent labs & imaging results that were available during my care of the patient were reviewed by me and considered in my medical decision making (see chart for details).     Patient with right middle ear effusion, left otitis media.  Will treat with amoxicillin  Final Clinical Impressions(s) / UC Diagnoses   Final diagnoses:  Non-recurrent acute suppurative otitis media of left ear without spontaneous rupture of tympanic membrane  Acute middle ear effusion, right    ED Discharge Orders        Ordered    amoxicillin (AMOXIL) 500 MG capsule  3 times daily     09/18/17 1712       Controlled  Substance Prescriptions [place] Controlled Substance Registry consulted? Not Applicable   Alecia Lemming, [place] 09/18/17 1713

## 2017-09-29 LAB — OB RESULTS CONSOLE GBS: STREP GROUP B AG: NEGATIVE

## 2017-10-16 ENCOUNTER — Other Ambulatory Visit: Payer: Self-pay

## 2017-10-16 ENCOUNTER — Encounter (HOSPITAL_COMMUNITY): Payer: Self-pay | Admitting: *Deleted

## 2017-10-16 ENCOUNTER — Inpatient Hospital Stay (HOSPITAL_COMMUNITY)
Admission: AD | Admit: 2017-10-16 | Discharge: 2017-10-19 | DRG: 807 | Disposition: A | Discharge status: Home / Self Care | Payer: Medicaid Other | Source: Ambulatory Visit | Attending: Obstetrics & Gynecology | Admitting: Obstetrics & Gynecology

## 2017-10-16 DIAGNOSIS — D649 Anemia, unspecified: Secondary | ICD-10-CM | POA: Diagnosis present

## 2017-10-16 DIAGNOSIS — Z3A37 37 weeks gestation of pregnancy: Secondary | ICD-10-CM

## 2017-10-16 DIAGNOSIS — O1404 Mild to moderate pre-eclampsia, complicating childbirth: Secondary | ICD-10-CM | POA: Diagnosis present

## 2017-10-16 DIAGNOSIS — O9902 Anemia complicating childbirth: Secondary | ICD-10-CM | POA: Diagnosis present

## 2017-10-16 DIAGNOSIS — O1490 Unspecified pre-eclampsia, unspecified trimester: Secondary | ICD-10-CM | POA: Diagnosis present

## 2017-10-16 LAB — URINALYSIS, ROUTINE W REFLEX MICROSCOPIC
Bilirubin Urine: NEGATIVE
Glucose, UA: NEGATIVE mg/dL
Ketones, ur: NEGATIVE mg/dL
Nitrite: NEGATIVE
Protein, ur: NEGATIVE mg/dL
Specific Gravity, Urine: 1.014 (ref 1.005–1.030)
pH: 6 (ref 5.0–8.0)

## 2017-10-16 LAB — CBC
HCT: 31.8 % — ABNORMAL LOW (ref 36.0–46.0)
HEMOGLOBIN: 10.9 g/dL — AB (ref 12.0–15.0)
MCH: 29.1 pg (ref 26.0–34.0)
MCHC: 34.3 g/dL (ref 30.0–36.0)
MCV: 84.8 fL (ref 78.0–100.0)
PLATELETS: 247 10*3/uL (ref 150–400)
RBC: 3.75 MIL/uL — ABNORMAL LOW (ref 3.87–5.11)
RDW: 12.4 % (ref 11.5–15.5)
WBC: 6.6 10*3/uL (ref 4.0–10.5)

## 2017-10-16 LAB — COMPREHENSIVE METABOLIC PANEL
ALK PHOS: 112 U/L (ref 38–126)
ALT: 7 U/L — AB (ref 14–54)
AST: 17 U/L (ref 15–41)
Albumin: 3.2 g/dL — ABNORMAL LOW (ref 3.5–5.0)
Anion gap: 9 (ref 5–15)
BUN: <5 mg/dL — ABNORMAL LOW (ref 6–20)
CALCIUM: 8.6 mg/dL — AB (ref 8.9–10.3)
CHLORIDE: 109 mmol/L (ref 101–111)
CO2: 19 mmol/L — AB (ref 22–32)
CREATININE: 0.53 mg/dL (ref 0.44–1.00)
GFR calc non Af Amer: >60 mL/min (ref >60)
GLUCOSE: 108 mg/dL — AB (ref 65–99)
Potassium: 3.2 mmol/L — ABNORMAL LOW (ref 3.5–5.1)
SODIUM: 137 mmol/L (ref 135–145)
Total Bilirubin: 0.2 mg/dL — ABNORMAL LOW (ref 0.3–1.2)
Total Protein: 7.1 g/dL (ref 6.5–8.1)

## 2017-10-16 LAB — URIC ACID: URIC ACID, SERUM: 5.1 mg/dL (ref 2.3–6.6)

## 2017-10-16 LAB — TYPE AND SCREEN
ABO/RH(D): B POS
Antibody Screen: NEGATIVE

## 2017-10-16 LAB — PROTEIN / CREATININE RATIO, URINE
Creatinine, Urine: 169 mg/dL
Protein Creatinine Ratio: 0.14 mg/mg{Cre} (ref 0.00–0.15)
TOTAL PROTEIN, URINE: 23 mg/dL

## 2017-10-16 LAB — LACTATE DEHYDROGENASE: LDH: 141 U/L (ref 98–192)

## 2017-10-16 MED ORDER — ACETAMINOPHEN 325 MG PO TABS: 650.0000 mg | ORAL_TABLET | ORAL | Status: DC | PRN

## 2017-10-16 MED ORDER — OXYTOCIN 40 UNITS IN LACTATED RINGERS INFUSION - SIMPLE MED
1.0000 m[IU]/min | INTRAVENOUS | Status: DC
  Administered 2017-10-17: 10 m[IU]/min via INTRAVENOUS
  Filled 2017-10-16: qty 1000

## 2017-10-16 MED ORDER — ONDANSETRON HCL 4 MG/2ML IJ SOLN: 4.0000 mg | Freq: Four times a day (QID) | INTRAMUSCULAR | Status: DC | PRN

## 2017-10-16 MED ORDER — LACTATED RINGERS IV SOLN
INTRAVENOUS | Status: DC
  Administered 2017-10-17 (×2): via INTRAVENOUS

## 2017-10-16 MED ORDER — MISOPROSTOL 25 MCG QUARTER TABLET
25.0000 ug | ORAL_TABLET | ORAL | Status: DC | PRN
  Administered 2017-10-16 – 2017-10-17 (×2): 25 ug via VAGINAL
  Filled 2017-10-16 (×3): qty 1

## 2017-10-16 MED ORDER — OXYTOCIN 40 UNITS IN LACTATED RINGERS INFUSION - SIMPLE MED: 2.5000 [IU]/h | INTRAVENOUS | Status: DC

## 2017-10-16 MED ORDER — LACTATED RINGERS IV SOLN
500.0000 mL | INTRAVENOUS | Status: DC | PRN
  Administered 2017-10-17: 1000 mL via INTRAVENOUS

## 2017-10-16 MED ORDER — ACETAMINOPHEN 325 MG PO TABS
650.0000 mg | ORAL_TABLET | Freq: Once | ORAL | Status: AC
  Administered 2017-10-16: 650 mg via ORAL
  Filled 2017-10-16: qty 2

## 2017-10-16 MED ORDER — TERBUTALINE SULFATE 1 MG/ML IJ SOLN
0.2500 mg | Freq: Once | INTRAMUSCULAR | Status: DC | PRN
  Filled 2017-10-16: qty 1

## 2017-10-16 MED ORDER — OXYTOCIN BOLUS FROM INFUSION
500.0000 mL | Freq: Once | INTRAVENOUS | Status: AC
  Administered 2017-10-17: 500 mL via INTRAVENOUS

## 2017-10-16 MED ORDER — LIDOCAINE HCL (PF) 1 % IJ SOLN
30.0000 mL | INTRAMUSCULAR | Status: AC | PRN
  Administered 2017-10-17: 30 mL via SUBCUTANEOUS
  Filled 2017-10-16: qty 30

## 2017-10-16 MED ORDER — SOD CITRATE-CITRIC ACID 500-334 MG/5ML PO SOLN: 30.0000 mL | ORAL | Status: DC | PRN

## 2017-10-16 NOTE — H&P (Signed)
Subjective:  Sylvia Chambers is a 22 y.o. G1 P0 female with EDC 11/02/17 at 37 and 3/[redacted] weeks gestation who is being admitted for Pre-eclampsia.  Her current obstetrical history is significant for pre-eclampsia.  Patient reports headache and visual changes and epigastric pain intermittently for 2 weeks, now consistent. PIH labs in office yesterday with PCR .533 resulted today.  Fetal Movement: normal.     Objective: Vitals:   10/16/17 1800 10/16/17 1815 10/16/17 1830 10/16/17 1845  BP: 132/83 (!) 141/82 126/89 (!) 133/91  Pulse: 93 95 91 93  Resp:      Temp:      Weight:      Height:      Review of Systems  Eyes: Positive for blurred vision.  Gastrointestinal: Positive for abdominal pain.  Neurological: Positive for headaches.  All other systems reviewed and are negative.   Physical Exam  Constitutional: She is oriented to person, place, and time and well-developed, well-nourished, and in no distress.  HENT:  Head: Normocephalic and atraumatic.  Eyes: Pupils are equal, round, and reactive to light.  Cardiovascular: Normal rate and regular rhythm.  Pulmonary/Chest: Effort normal and breath sounds normal.  Abdominal: Bowel sounds are normal.  Genitourinary: Vagina normal and cervix normal.  Musculoskeletal: Normal range of motion.  Neurological: She is alert and oriented to person, place, and time. Gait normal.  Skin: Skin is warm and dry.  Psychiatric: Mood, memory, affect and judgment normal.    Pelvis:  Vulva and vagina appear normal. Bimanual exam reveals normal uterus and adnexa.  FHT:  145 with moderate beat to beat variability, +accels, -decels  Uterine Size: size equals dates  Presentations: cephalic  Cervix:    Dilation: 2cm   Effacement: 50%   Station:  -2   Consistency: medium   Position: posterior   Lab Review  B, Rh+, Rubella-immune, Hepatitis B surface antigen non-reactive, GBS negative  AFP:NML  One hour GTT: Normal    Assessment/Plan:  37 and 3/[redacted] weeks  gestation. Not in labor. Category 1 FHTs Obstetrical history significant for pre-eclampsia.   Stat repeat PIH labs Anticipate admission for IOL for Preeclampsia

## 2017-10-16 NOTE — MAU Note (Signed)
Pt sent from office for elevated b/p. C/o headache and some blurred vision. Reports decreased fetal  Movement today as well.

## 2017-10-17 ENCOUNTER — Encounter (HOSPITAL_COMMUNITY): Payer: Self-pay | Admitting: *Deleted

## 2017-10-17 ENCOUNTER — Inpatient Hospital Stay (HOSPITAL_COMMUNITY): Payer: Medicaid Other | Admitting: Anesthesiology

## 2017-10-17 LAB — CBC WITH DIFFERENTIAL/PLATELET
BASOS ABS: 0 10*3/uL (ref 0.0–0.1)
Basophils Relative: 0 %
Eosinophils Absolute: 0.1 10*3/uL (ref 0.0–0.7)
Eosinophils Relative: 1 %
HEMATOCRIT: 33 % — AB (ref 36.0–46.0)
HEMOGLOBIN: 11.2 g/dL — AB (ref 12.0–15.0)
Lymphocytes Relative: 25 %
Lymphs Abs: 2.3 10*3/uL (ref 0.7–4.0)
MCH: 28.9 pg (ref 26.0–34.0)
MCHC: 33.9 g/dL (ref 30.0–36.0)
MCV: 85.3 fL (ref 78.0–100.0)
Monocytes Absolute: 0.3 10*3/uL (ref 0.1–1.0)
Monocytes Relative: 4 %
NEUTROS PCT: 70 %
Neutro Abs: 6.6 10*3/uL (ref 1.7–7.7)
Platelets: 256 10*3/uL (ref 150–400)
RBC: 3.87 MIL/uL (ref 3.87–5.11)
RDW: 12.3 % (ref 11.5–15.5)
WBC: 9.4 10*3/uL (ref 4.0–10.5)

## 2017-10-17 LAB — RPR: RPR: NONREACTIVE

## 2017-10-17 MED ORDER — EPHEDRINE 5 MG/ML INJ
10.0000 mg | INTRAVENOUS | Status: DC | PRN
  Filled 2017-10-17: qty 2

## 2017-10-17 MED ORDER — LIDOCAINE HCL (PF) 1 % IJ SOLN
INTRAMUSCULAR | Status: DC | PRN
  Administered 2017-10-17 (×2): 5 mL via EPIDURAL

## 2017-10-17 MED ORDER — LACTATED RINGERS IV SOLN: 500.0000 mL | Freq: Once | INTRAVENOUS | Status: DC

## 2017-10-17 MED ORDER — DIBUCAINE 1 % RE OINT: 1.0000 "application " | TOPICAL_OINTMENT | RECTAL | Status: DC | PRN

## 2017-10-17 MED ORDER — ONDANSETRON HCL 4 MG/2ML IJ SOLN: 4.0000 mg | INTRAMUSCULAR | Status: DC | PRN

## 2017-10-17 MED ORDER — DIPHENHYDRAMINE HCL 50 MG/ML IJ SOLN: 12.5000 mg | INTRAMUSCULAR | Status: DC | PRN

## 2017-10-17 MED ORDER — ACETAMINOPHEN 325 MG PO TABS
650.0000 mg | ORAL_TABLET | ORAL | Status: DC | PRN
  Administered 2017-10-18: 650 mg via ORAL
  Filled 2017-10-17: qty 2

## 2017-10-17 MED ORDER — WITCH HAZEL-GLYCERIN EX PADS: 1.0000 "application " | MEDICATED_PAD | CUTANEOUS | Status: DC | PRN

## 2017-10-17 MED ORDER — SENNOSIDES-DOCUSATE SODIUM 8.6-50 MG PO TABS
2.0000 | ORAL_TABLET | ORAL | Status: DC
  Administered 2017-10-18 (×2): 2 via ORAL
  Filled 2017-10-17 (×2): qty 2

## 2017-10-17 MED ORDER — PRENATAL MULTIVITAMIN CH
1.0000 | ORAL_TABLET | Freq: Every day | ORAL | Status: DC
  Administered 2017-10-18 – 2017-10-19 (×2): 1 via ORAL
  Filled 2017-10-17 (×2): qty 1

## 2017-10-17 MED ORDER — HYDRALAZINE HCL 20 MG/ML IJ SOLN: 10.0000 mg | Freq: Once | INTRAMUSCULAR | Status: DC | PRN

## 2017-10-17 MED ORDER — IBUPROFEN 600 MG PO TABS
600.0000 mg | ORAL_TABLET | Freq: Four times a day (QID) | ORAL | Status: DC
  Administered 2017-10-18 – 2017-10-19 (×7): 600 mg via ORAL
  Filled 2017-10-17 (×7): qty 1

## 2017-10-17 MED ORDER — SIMETHICONE 80 MG PO CHEW: 80.0000 mg | CHEWABLE_TABLET | ORAL | Status: DC | PRN

## 2017-10-17 MED ORDER — ONDANSETRON HCL 4 MG PO TABS: 4.0000 mg | ORAL_TABLET | ORAL | Status: DC | PRN

## 2017-10-17 MED ORDER — BENZOCAINE-MENTHOL 20-0.5 % EX AERO
1.0000 "application " | INHALATION_SPRAY | CUTANEOUS | Status: DC | PRN
  Filled 2017-10-17: qty 56

## 2017-10-17 MED ORDER — LACTATED RINGERS IV SOLN
INTRAVENOUS | Status: DC
  Administered 2017-10-17 – 2017-10-18 (×3): via INTRAVENOUS

## 2017-10-17 MED ORDER — BUTALBITAL-APAP-CAFFEINE 50-325-40 MG PO TABS
1.0000 | ORAL_TABLET | ORAL | Status: DC | PRN
Start: 2017-10-17 — End: 2017-10-17
  Administered 2017-10-17: 1 via ORAL
  Filled 2017-10-17: qty 1

## 2017-10-17 MED ORDER — PHENYLEPHRINE 40 MCG/ML (10ML) SYRINGE FOR IV PUSH (FOR BLOOD PRESSURE SUPPORT)
80.0000 ug | PREFILLED_SYRINGE | INTRAVENOUS | Status: DC | PRN
  Filled 2017-10-17: qty 5

## 2017-10-17 MED ORDER — TETANUS-DIPHTH-ACELL PERTUSSIS 5-2.5-18.5 LF-MCG/0.5 IM SUSP: 0.5000 mL | Freq: Once | INTRAMUSCULAR | Status: DC

## 2017-10-17 MED ORDER — MEDROXYPROGESTERONE ACETATE 150 MG/ML IM SUSP
150.0000 mg | INTRAMUSCULAR | Status: AC | PRN
  Administered 2017-10-19: 150 mg via INTRAMUSCULAR
  Filled 2017-10-17: qty 1

## 2017-10-17 MED ORDER — DIPHENHYDRAMINE HCL 25 MG PO CAPS: 25.0000 mg | ORAL_CAPSULE | Freq: Four times a day (QID) | ORAL | Status: DC | PRN

## 2017-10-17 MED ORDER — COCONUT OIL OIL: 1.0000 "application " | TOPICAL_OIL | Status: DC | PRN

## 2017-10-17 MED ORDER — ZOLPIDEM TARTRATE 5 MG PO TABS: 5.0000 mg | ORAL_TABLET | Freq: Every evening | ORAL | Status: DC | PRN

## 2017-10-17 MED ORDER — MAGNESIUM SULFATE 40 G IN LACTATED RINGERS - SIMPLE
2.0000 g/h | INTRAVENOUS | Status: AC
  Administered 2017-10-17 – 2017-10-18 (×2): 2 g/h via INTRAVENOUS
  Filled 2017-10-17 (×2): qty 40

## 2017-10-17 MED ORDER — MAGNESIUM SULFATE BOLUS VIA INFUSION
4.0000 g | Freq: Once | INTRAVENOUS | Status: AC
  Administered 2017-10-17 (×2): 4 g via INTRAVENOUS
  Filled 2017-10-17: qty 500

## 2017-10-17 MED ORDER — FENTANYL CITRATE (PF) 100 MCG/2ML IJ SOLN
100.0000 ug | INTRAMUSCULAR | Status: DC | PRN
  Administered 2017-10-17: 100 ug via INTRAVENOUS
  Filled 2017-10-17: qty 2

## 2017-10-17 MED ORDER — LABETALOL HCL 5 MG/ML IV SOLN: 20.0000 mg | INTRAVENOUS | Status: DC | PRN

## 2017-10-17 MED ORDER — FENTANYL 2.5 MCG/ML BUPIVACAINE 1/10 % EPIDURAL INFUSION (WH - ANES)
14.0000 mL/h | INTRAMUSCULAR | Status: DC | PRN
  Administered 2017-10-17: 14 mL/h via EPIDURAL

## 2017-10-17 MED ORDER — FENTANYL 2.5 MCG/ML BUPIVACAINE 1/10 % EPIDURAL INFUSION (WH - ANES)
INTRAMUSCULAR | Status: AC
  Filled 2017-10-17: qty 100

## 2017-10-17 NOTE — Progress Notes (Signed)
Sylvia Chambers is a 22 y.o. G1P0 at [redacted]w[redacted]d   Subjective: No complaints.  Comfortable s/p IV fentanyl x 1 and now with epidural.  Tracing was showing early decels.  I discussed with patient and discussed placing an IUPC.  She was agreeable.  Upon exam significant bleeding noted with additional fluid leaking out as well.  As IUPC was being placed, there was LOC with prolonged decel.  Discussed placing an FSE and the patient is agreeable.   Objective: BP (!) 142/96   Pulse 83   Temp 98.3 F (36.8 C)   Resp 17   Ht  (1.6 m)   Wt 203 lb (92.1 kg)   LMP 01/27/2017 (Exact Date)   SpO2 98%   BMI 35.96 kg/m  No intake/output data recorded. No intake/output data recorded.  FHT:  FHR: 135 bpm, variability: moderate,  accelerations:  Abscent,  decelerations:  Absent UC:   regular, every 2 minutes SVE:   Dilation: 4 Effacement (%): 90 Station: 0 Exam by:: [employer] IUPC placed FSE placed  Labs: Lab Results  Component Value Date   WBC 9.4 10/17/2017   HGB 11.2 (L) 10/17/2017   HCT 33.0 (L) 10/17/2017   MCV 85.3 10/17/2017   PLT 256 10/17/2017    Assessment / Plan: Progressing normally on pitocin  Labor: Progressing normally.  Pitocin decreased by half with return of moderate variability and no decels.  Ctx appear adequate.  Will cont to observe. Preeclampsia:  no signs or symptoms of toxicity Fetal Wellbeing:  Category I and Category II.  Now cat 1 once pitocin decreased. Pain Control:  Epidural I/D:  GBS neg Anticipated MOD:  NSVD  Purcell Nails 10/17/2017, 1:43 PM

## 2017-10-17 NOTE — Plan of Care (Signed)
Pt and mother educated on medications and labor/induction process. All questions answered. Will continue to monitor and communicate with healthcare team

## 2017-10-17 NOTE — Progress Notes (Signed)
Magnesium Sulfate initiated at 2011, was paused at 2017 due to complaint by patient and parent of patient that they were not told about the plan of care. RN to call provider to come see patient. Illene Bolus, CNM notified.

## 2017-10-17 NOTE — Anesthesia Procedure Notes (Signed)
Epidural Patient location during procedure: OB Start time: 10/17/2017 12:39 PM End time: 10/17/2017 12:49 PM  Staffing Anesthesiologist: Achille Rich, MD Performed: anesthesiologist   Preanesthetic Checklist Completed: patient identified, site marked, pre-op evaluation, timeout performed, IV checked, risks and benefits discussed and monitors and equipment checked  Epidural Patient position: sitting Prep: DuraPrep Patient monitoring: heart rate, cardiac monitor, continuous pulse ox and blood pressure Approach: midline Location: L2-L3 Injection technique: LOR saline  Needle:  Needle type: Tuohy  Needle gauge: 17 G Needle length: 9 cm Needle insertion depth: 7 cm Catheter type: closed end flexible Catheter size: 19 Gauge Catheter at skin depth: 12 cm Test dose: negative and Other  Assessment Events: blood not aspirated, injection not painful, no injection resistance and negative IV test  Additional Notes Informed consent obtained prior to proceeding including risk of failure, 1% risk of PDPH, risk of minor discomfort and bruising.  Discussed rare but serious complications including epidural abscess, permanent nerve injury, epidural hematoma.  Discussed alternatives to epidural analgesia and patient desires to proceed.  Timeout performed pre-procedure verifying patient name, procedure, and platelet count.  Patient tolerated procedure well. Reason for block:procedure for pain

## 2017-10-17 NOTE — Anesthesia Preprocedure Evaluation (Signed)
Anesthesia Evaluation  Patient identified by MRN, date of birth, ID band Patient awake    Reviewed: Allergy & Precautions, H&P , NPO status , Patient's Chart, lab work & pertinent test results  Airway Mallampati: II   Neck ROM: full    Dental   Pulmonary asthma ,    breath sounds clear to auscultation       Cardiovascular hypertension,  Rhythm:regular Rate:Normal     Neuro/Psych    GI/Hepatic   Endo/Other    Renal/GU      Musculoskeletal   Abdominal   Peds  Hematology  (+) anemia ,   Anesthesia Other Findings   Reproductive/Obstetrics (+) Pregnancy                             Anesthesia Physical Anesthesia Plan  ASA: II  Anesthesia Plan: Epidural   Post-op Pain Management:    Induction: Intravenous  PONV Risk Score and Plan: 2 and Treatment may vary due to age or medical condition  Airway Management Planned: Natural Airway  Additional Equipment:   Intra-op Plan:   Post-operative Plan:   Informed Consent: I have reviewed the patients History and Physical, chart, labs and discussed the procedure including the risks, benefits and alternatives for the proposed anesthesia with the patient or authorized representative who has indicated his/her understanding and acceptance.     Plan Discussed with: Anesthesiologist  Anesthesia Plan Comments:         Anesthesia Quick Evaluation

## 2017-10-17 NOTE — Progress Notes (Addendum)
Sylvia Chambers is a 22 y.o. G1P0 at [redacted]w[redacted]d admitted for induction d/t preeclampsia s/p cytotec x 2  Subjective: Called by RN and informed pt was 3/80/-2 and asked about placing another cytotec vs pitocin.  I ordered pitocin to start after pt ate (pt was requesting breakfast).    Objective: BP 137/90   Pulse 69   Temp 98.2 F (36.8 C)   Resp 16   Ht  (1.6 m)   Wt 203 lb (92.1 kg)   LMP 01/27/2017 (Exact Date)   SpO2 99%   BMI 35.96 kg/m  No intake/output data recorded. No intake/output data recorded.  FHT:  FHR: 135 bpm, variability: moderate,  accelerations:  Present,  decelerations:  Absent UC:   q 2-4 min SVE:   Dilation: 3 Effacement (%): 80 Station: -2 Exam by:: Polos RN  Labs: Lab Results  Component Value Date   WBC 6.6 10/16/2017   HGB 10.9 (L) 10/16/2017   HCT 31.8 (L) 10/16/2017   MCV 84.8 10/16/2017   PLT 247 10/16/2017    Assessment / Plan: Induction of labor due to preeclampsia progressing well s/p cytotec.  Labor: will start pitocin Preeclampsia:  no signs or symptoms of toxicity Fetal Wellbeing:  Category I Pain Control:  Labor support without medications  I/D:  GBS neg Anticipated MOD:  NSVD  Purcell Nails 10/17/2017, 10:41 AM

## 2017-10-17 NOTE — Progress Notes (Signed)
Post Partum Day 0 Subjective: no complaints, up ad lib, voiding and tolerating PO  Objective: Blood pressure (!) 158/95, pulse 66, temperature 98.5 F (36.9 C), temperature source Oral, resp. rate 17, height  (1.6 m), weight 92.1 kg (203 lb), last menstrual period 01/27/2017, SpO2 99 %, unknown if currently breastfeeding.  Physical Exam:  General: alert and cooperative Lochia: appropriate Uterine Fundus: firm Incision: n/a DVT Evaluation: No evidence of DVT seen on physical exam. Negative Homan's sign. No cords or calf tenderness. No significant calf/ankle edema.  Results for orders placed or performed during the hospital encounter of 10/16/17 (from the past 24 hour(s))  CBC with Differential     Status: Abnormal   Collection Time: 10/17/17 12:16 PM  Result Value Ref Range   WBC 9.4 4.0 - 10.5 K/uL   RBC 3.87 3.87 - 5.11 MIL/uL   Hemoglobin 11.2 (L) 12.0 - 15.0 g/dL   HCT 16.1 (L) 09.6 - 04.5 %   MCV 85.3 78.0 - 100.0 fL   MCH 28.9 26.0 - 34.0 pg   MCHC 33.9 30.0 - 36.0 g/dL   RDW 40.9 81.1 - 91.4 %   Platelets 256 150 - 400 K/uL   Neutrophils Relative % 70 %   Neutro Abs 6.6 1.7 - 7.7 K/uL   Lymphocytes Relative 25 %   Lymphs Abs 2.3 0.7 - 4.0 K/uL   Monocytes Relative 4 %   Monocytes Absolute 0.3 0.1 - 1.0 K/uL   Eosinophils Relative 1 %   Eosinophils Absolute 0.1 0.0 - 0.7 K/uL   Basophils Relative 0 %   Basophils Absolute 0.0 0.0 - 0.1 K/uL   Vitals:   10/17/17 1751 10/17/17 1810 10/17/17 1900 10/17/17 2011  BP: 128/88 134/87 (!) 138/92 (!) 158/95  Pulse: 84 78 78 66  Resp:  Temp:      TempSrc:      SpO2:  98% 99%   Weight:      Height:        Assessment/Plan: Plan for discharge tomorrow and Contraception Depo Provera before discharge    Discussed reason for moving to third floor and reason for Magnesium Sulfate for 24 hours. Family had opportunity to ask questions and have them answered. RN restarting Magnesium Sulfate.    LOS: 1 day    Janeece Riggers 10/17/2017, 8:39 PM

## 2017-10-17 NOTE — Progress Notes (Signed)
Sylvia Chambers is a 22 y.o. G1P0 at [redacted]w[redacted]d   Subjective: Pt had slight HA this morning and was given fioricet with resolution of sxs.  Pt feeling ctxs 5/10.  No VB.  Objective: BP 135/85   Pulse 77   Temp 98.1 F (36.7 C)   Resp 20   Ht  (1.6 m)   Wt 203 lb (92.1 kg)   LMP 01/27/2017 (Exact Date)   SpO2 99%   BMI 35.96 kg/m  No intake/output data recorded. No intake/output data recorded.  FHT:  FHR: 140 bpm, variability: moderate,  accelerations:  Present,  decelerations:  Absent UC:   q 2-5 min SVE:   Dilation: 3 Effacement (%): 80 Station: -1 Exam by:: [employer]: Lab Results  Component Value Date   WBC 6.6 10/16/2017   HGB 10.9 (L) 10/16/2017   HCT 31.8 (L) 10/16/2017   MCV 84.8 10/16/2017   PLT 247 10/16/2017    Assessment / Plan: Induction of labor due to preeclampsia,  progressing well on pitocin  Labor: Cont to titrate pitocin as indicated Preeclampsia:  no signs or symptoms of toxicity Fetal Wellbeing:  Category I Pain Control:  Labor support without medications epidural, IV pain meds or nitrous upon request I/D:  GBS neg Anticipated MOD:  NSVD  Purcell Nails 10/17/2017, 12:26 PM

## 2017-10-17 NOTE — Anesthesia Pain Management Evaluation Note (Signed)
  CRNA Pain Management Visit Note  Patient: Sylvia Chambers, 22 y.o., female  "Hello I am a member of the anesthesia team at [facility]. We have an anesthesia team available at all times to provide care throughout the hospital, including epidural management and anesthesia for C-section. I don't know your plan for the delivery whether it a natural birth, water birth, IV sedation, nitrous supplementation, doula or epidural, but we want to meet your pain goals."   1.Was your pain managed to your expectations on prior hospitalizations?   No prior hospitalizations  2.What is your expectation for pain management during this hospitalization?     Epidural  3.How can we help you reach that goal? Be available when patient is ready for epidural   Record the patient's initial score and the patient's pain goal.   Pain: 7  Pain Goal: 3 [facility] wants you to be able to say your pain was always managed very well.  Jennelle Human 10/17/2017

## 2017-10-17 NOTE — Progress Notes (Addendum)
Post Partum Day 1 Subjective: no complaints, up ad lib, voiding and tolerating PO, resting comfortably. Patient was induced at 37 weeks for preeclampsia. Asymptomatic for anemia but will still discharge with Iron supplement.   Objective: Blood pressure 126/83, pulse 80, temperature 98.6 F (37 C), temperature source Oral, resp. rate 16, height  (1.6 m), weight 92.1 kg (203 lb), last menstrual period 01/27/2017, SpO2 98 %, unknown if currently breastfeeding.  Physical Exam:  General: alert and cooperative Lochia: appropriate Uterine Fundus: firm Incision: n/a DVT Evaluation: No evidence of DVT seen on physical exam. Negative Homan's sign. No cords or calf tenderness. No significant calf/ankle edema.  Results for orders placed or performed during the hospital encounter of 10/16/17 (from the past 24 hour(s))  CBC with Differential     Status: Abnormal   Collection Time: 10/17/17 12:16 PM  Result Value Ref Range   WBC 9.4 4.0 - 10.5 K/uL   RBC 3.87 3.87 - 5.11 MIL/uL   Hemoglobin 11.2 (L) 12.0 - 15.0 g/dL   HCT 16.1 (L) 09.6 - 04.5 %   MCV 85.3 78.0 - 100.0 fL   MCH 28.9 26.0 - 34.0 pg   MCHC 33.9 30.0 - 36.0 g/dL   RDW 40.9 81.1 - 91.4 %   Platelets 256 150 - 400 K/uL   Neutrophils Relative % 70 %   Neutro Abs 6.6 1.7 - 7.7 K/uL   Lymphocytes Relative 25 %   Lymphs Abs 2.3 0.7 - 4.0 K/uL   Monocytes Relative 4 %   Monocytes Absolute 0.3 0.1 - 1.0 K/uL   Eosinophils Relative 1 %   Eosinophils Absolute 0.1 0.0 - 0.7 K/uL   Basophils Relative 0 %   Basophils Absolute 0.0 0.0 - 0.1 K/uL  Comprehensive metabolic panel     Status: Abnormal   Collection Time: 10/17/17 11:52 PM  Result Value Ref Range   Sodium 140 135 - 145 mmol/L   Potassium 3.7 3.5 - 5.1 mmol/L   Chloride 110 101 - 111 mmol/L   CO2 22 22 - 32 mmol/L   Glucose, Bld 121 (H) 65 - 99 mg/dL   BUN <5 (L) 6 - 20 mg/dL   Creatinine, Ser 7.82 0.44 - 1.00 mg/dL   Calcium 9.0 8.9 - 95.6 mg/dL   Total Protein 5.8  (L) 6.5 - 8.1 g/dL   Albumin 2.7 (L) 3.5 - 5.0 g/dL   AST 21 15 - 41 U/L   ALT 8 (L) 14 - 54 U/L   Alkaline Phosphatase 100 38 - 126 U/L   Total Bilirubin 0.3 0.3 - 1.2 mg/dL   GFR calc non Af Amer >60 >60 mL/min   GFR calc Af Amer >60 >60 mL/min   Anion gap 8 5 - 15  CBC     Status: Abnormal   Collection Time: 10/17/17 11:52 PM  Result Value Ref Range   WBC 10.1 4.0 - 10.5 K/uL   RBC 3.53 (L) 3.87 - 5.11 MIL/uL   Hemoglobin 10.3 (L) 12.0 - 15.0 g/dL   HCT 21.3 (L) 08.6 - 57.8 %   MCV 85.6 78.0 - 100.0 fL   MCH 29.2 26.0 - 34.0 pg   MCHC 34.1 30.0 - 36.0 g/dL   RDW 46.9 62.9 - 52.8 %   Platelets 225 150 - 400 K/uL  Magnesium     Status: Abnormal   Collection Time: 10/17/17 11:52 PM  Result Value Ref Range   Magnesium 3.7 (H) 1.7 - 2.4 mg/dL  CBC  Status: Abnormal   Collection Time: 10/18/17  5:27 AM  Result Value Ref Range   WBC 9.4 4.0 - 10.5 K/uL   RBC 3.32 (L) 3.87 - 5.11 MIL/uL   Hemoglobin 9.6 (L) 12.0 - 15.0 g/dL   HCT 96.0 (L) 45.4 - 09.8 %   MCV 84.9 78.0 - 100.0 fL   MCH 28.9 26.0 - 34.0 pg   MCHC 34.0 30.0 - 36.0 g/dL   RDW 11.9 14.7 - 82.9 %   Platelets 237 150 - 400 K/uL   Vitals:   10/18/17 0105 10/18/17 0200 10/18/17 0300 10/18/17 0410  BP: 112/65 (!) 142/71 127/61 126/83  Pulse: 79 72 86 80  Resp: Temp: 97.6 F (36.4 C) (!) 97.5 F (36.4 C) 98.5 F (36.9 C) 98.6 F (37 C)  TempSrc: Oral Oral Oral Oral  SpO2: 98% 100% 100% 98%  Weight:      Height:        Assessment/Plan: Plan for discharge tomorrow and Contraception Depo Provera before discharge     LOS: 2 days   Sylvia Chambers 10/18/2017, 6:04 AM   ADDENDUM: Pt was considering early discharge today.  Discussed with patient and family that Magnesium would be discontinued 24hr from delivery and then plan to watch BPs overnight.  Currently she is asymptomatic and BP within normal range.  Questions and concerns were addressed.  Myna Hidalgo, DO [phone]  (cell) [phone] (office)

## 2017-10-18 LAB — CBC
HCT: 30.2 % — ABNORMAL LOW (ref 36.0–46.0)
HEMATOCRIT: 28.2 % — AB (ref 36.0–46.0)
HEMOGLOBIN: 9.6 g/dL — AB (ref 12.0–15.0)
Hemoglobin: 10.3 g/dL — ABNORMAL LOW (ref 12.0–15.0)
MCH: 28.9 pg (ref 26.0–34.0)
MCH: 29.2 pg (ref 26.0–34.0)
MCHC: 34 g/dL (ref 30.0–36.0)
MCHC: 34.1 g/dL (ref 30.0–36.0)
MCV: 84.9 fL (ref 78.0–100.0)
MCV: 85.6 fL (ref 78.0–100.0)
PLATELETS: 225 10*3/uL (ref 150–400)
Platelets: 237 10*3/uL (ref 150–400)
RBC: 3.32 MIL/uL — ABNORMAL LOW (ref 3.87–5.11)
RBC: 3.53 MIL/uL — AB (ref 3.87–5.11)
RDW: 12.2 % (ref 11.5–15.5)
RDW: 12.2 % (ref 11.5–15.5)
WBC: 10.1 10*3/uL (ref 4.0–10.5)
WBC: 9.4 10*3/uL (ref 4.0–10.5)

## 2017-10-18 LAB — COMPREHENSIVE METABOLIC PANEL
ALBUMIN: 2.6 g/dL — AB (ref 3.5–5.0)
ALK PHOS: 97 U/L (ref 38–126)
ALT: 8 U/L — AB (ref 14–54)
ALT: 8 U/L — ABNORMAL LOW (ref 14–54)
ANION GAP: 8 (ref 5–15)
AST: 19 U/L (ref 15–41)
AST: 21 U/L (ref 15–41)
Albumin: 2.7 g/dL — ABNORMAL LOW (ref 3.5–5.0)
Alkaline Phosphatase: 100 U/L (ref 38–126)
Anion gap: 8 (ref 5–15)
BILIRUBIN TOTAL: 0.3 mg/dL (ref 0.3–1.2)
CHLORIDE: 110 mmol/L (ref 101–111)
CO2: 22 mmol/L (ref 22–32)
CO2: 22 mmol/L (ref 22–32)
CREATININE: 0.57 mg/dL (ref 0.44–1.00)
Calcium: 8.2 mg/dL — ABNORMAL LOW (ref 8.9–10.3)
Calcium: 9 mg/dL (ref 8.9–10.3)
Chloride: 108 mmol/L (ref 101–111)
Creatinine, Ser: 0.66 mg/dL (ref 0.44–1.00)
GFR calc Af Amer: >60 mL/min (ref >60)
GFR calc Af Amer: >60 mL/min (ref >60)
GFR calc non Af Amer: >60 mL/min (ref >60)
GLUCOSE: 97 mg/dL (ref 65–99)
Glucose, Bld: 121 mg/dL — ABNORMAL HIGH (ref 65–99)
POTASSIUM: 3.2 mmol/L — AB (ref 3.5–5.1)
Potassium: 3.7 mmol/L (ref 3.5–5.1)
SODIUM: 140 mmol/L (ref 135–145)
Sodium: 138 mmol/L (ref 135–145)
TOTAL PROTEIN: 5.7 g/dL — AB (ref 6.5–8.1)
TOTAL PROTEIN: 5.8 g/dL — AB (ref 6.5–8.1)
Total Bilirubin: 0.3 mg/dL (ref 0.3–1.2)

## 2017-10-18 LAB — MAGNESIUM: Magnesium: 3.7 mg/dL — ABNORMAL HIGH (ref 1.7–2.4)

## 2017-10-18 NOTE — Anesthesia Postprocedure Evaluation (Signed)
Anesthesia Post Note  Patient: Sylvia Chambers  Procedure(s) Performed: AN AD HOC LABOR EPIDURAL     Patient location during evaluation: Mother Baby Anesthesia Type: Epidural Level of consciousness: awake and alert Pain management: pain level controlled Vital Signs Assessment: post-procedure vital signs reviewed and stable Respiratory status: spontaneous breathing, nonlabored ventilation and respiratory function stable Cardiovascular status: stable Postop Assessment: no headache, no backache and epidural receding Anesthetic complications: no    Last Vitals:  Vitals:   10/18/17 0300 10/18/17 0410  BP: 127/61 126/83  Pulse: 86 80  Resp: 16 16  Temp: 36.9 C 37 C  SpO2: 100% 98%    Last Pain:  Vitals:   10/18/17 0410  TempSrc: Oral  PainSc: Asleep   Pain Goal: Patients Stated Pain Goal: 0 (10/17/17 2011)               Yoltzin Ransom

## 2017-10-18 NOTE — Lactation Note (Signed)
This note was copied from a baby's chart. Lactation Consultation Note;  Per RN mom has decided she wants to breast feed her baby. As I went into room, mom is offering bottle of formula. Offered assist with latch. Mom refused. States she wants to pump and bottle feed EBM to baby- does not want to put baby to the breast. RN will set up pump for her. BF brochure given. Reviewed our phone number, OP appointments and BFS[facility] as resources for support after DC. Encouraged to pump q 3 hours to promote a good milk supply. Has WIC but discussed that [facility] won't give breast pump and formula. Grandmother asking about buying a pump- reviewed options with her. No further questions at present To call prn  Patient Name: Sylvia Chambers [id number] Date: 10/18/2017 Reason for consult: Initial assessment;Early term 37-38.6wks;Primapara   Maternal Data Formula Feeding for Exclusion: Yes Reason for exclusion: Mother's choice to formula and breast feed on admission Does the patient have breastfeeding experience prior to this delivery?: No  Feeding    LATCH Score                   Interventions    Lactation Tools Discussed/Used WIC Program: Yes   Consult Status      Pamelia Hoit 10/18/2017, 8:47 AM

## 2017-10-18 NOTE — Anesthesia Postprocedure Evaluation (Signed)
Anesthesia Post Note  Patient: Sylvia Chambers  Procedure(s) Performed: AN AD HOC LABOR EPIDURAL     Patient location during evaluation: Mother Baby Anesthesia Type: Epidural Level of consciousness: awake Pain management: satisfactory to patient Vital Signs Assessment: post-procedure vital signs reviewed and stable Respiratory status: spontaneous breathing Cardiovascular status: stable Anesthetic complications: no    Last Vitals:  Vitals:   10/18/17 0812 10/18/17 1206  BP: (!) 129/92 128/83  Pulse: 89 78  Resp: 17 18  Temp: 36.7 C (!) 36.4 C  SpO2: 96% 98%    Last Pain:  Vitals:   10/18/17 1400  TempSrc:   PainSc: 0-No pain   Pain Goal: Patients Stated Pain Goal: 0 (10/17/17 2011)               Cephus Shelling

## 2017-10-19 MED ORDER — FERROUS SULFATE 325 (65 FE) MG PO TABS: 325.0000 mg | ORAL_TABLET | Freq: Every day | ORAL | 3 refills | Status: DC

## 2017-10-19 MED ORDER — MEDROXYPROGESTERONE ACETATE 150 MG/ML IM SUSP: 150.0000 mg | INTRAMUSCULAR | 0 refills | Status: DC

## 2017-10-19 MED ORDER — IBUPROFEN 600 MG PO TABS: 600.0000 mg | ORAL_TABLET | Freq: Four times a day (QID) | ORAL | 0 refills | Status: DC

## 2017-10-19 NOTE — Discharge Summary (Signed)
OB Discharge Summary     Patient Name: Sylvia Chambers DOB: 06-03-1996 MRN: [id number]  Date of admission: 10/16/2017 Delivering MD: Osborn Coho   Date of discharge: 10/19/2017  Admitting diagnosis:  37.4 WKS PRECLAMPSIA CHECK Intrauterine pregnancy: [redacted]w[redacted]d     Secondary diagnosis:  Active Problems:   Preeclampsia  Additional problems: none    Discharge diagnosis: Term Pregnancy Delivered, Preeclampsia (mild) and Anemia                                                                                                Post partum procedures:Depo  Augmentation: AROM and Pitocin  Complications: None  Hospital course:  Induction of Labor With Vaginal Delivery   22 y.o. yo G1P1001 at [redacted]w[redacted]d was admitted to the hospital 10/16/2017 for induction of labor.  Indication for induction: Preeclampsia.  Patient had an uncomplicated labor course as follows: Membrane Rupture Time/Date: 10:59 AM ,10/17/2017   Intrapartum Procedures: Episiotomy:                                           Lacerations:  1st degree [2]  Patient had delivery of a Viable infant.  Information for the patient's newborn:  Jaylise, Peek [[id number]]  Delivery Method: Vag-Spont   10/17/2017  Details of delivery can be found in separate delivery note.  Patient had a routine postpartum course. Patient is discharged home 10/19/17.  Physical exam  Vitals:   10/18/17 1601 10/18/17 2000 10/19/17 0019 10/19/17 0435  BP: (!) 128/96 (!) 134/94 (!) 140/99 (!) 139/100  Pulse: 75 78 62 68  Resp: Temp: 98.2 F (36.8 C) 98.2 F (36.8 C) 98.1 F (36.7 C) 97.8 F (36.6 C)  TempSrc: Oral Oral Oral Oral  SpO2: 100% 100% 100% 99%  Weight:      Height:       General: alert, cooperative and no distress Lochia: appropriate Uterine Fundus: firm Vaginal laceration : Healing well with no significant drainage, No significant erythema DVT Evaluation: No evidence of DVT seen on physical exam. Negative Homan's  sign. No cords or calf tenderness. No significant calf/ankle edema. Labs: Lab Results  Component Value Date   WBC 9.4 10/18/2017   HGB 9.6 (L) 10/18/2017   HCT 28.2 (L) 10/18/2017   MCV 84.9 10/18/2017   PLT 237 10/18/2017   CMP Latest Ref Rng & Units 10/18/2017  Glucose 65 - 99 mg/dL 97  BUN 6 - 20 mg/dL <7(W)  Creatinine 2.95 - 1.00 mg/dL 6.21  Sodium 308 - 657 mmol/L 138  Potassium 3.5 - 5.1 mmol/L 3.2(L)  Chloride 101 - 111 mmol/L 108  CO2 22 - 32 mmol/L 22  Calcium 8.9 - 10.3 mg/dL 8.2(L)  Total Protein 6.5 - 8.1 g/dL 8.4(O)  Total Bilirubin 0.3 - 1.2 mg/dL 0.3  Alkaline Phos 38 - 126 U/L 97  AST 15 - 41 U/L 19  ALT 14 - 54 U/L 8(L)    Discharge instruction: per After Visit Summary  and "Baby and Me Booklet".  After visit meds:  Allergies as of 10/19/2017   No Known Allergies     Medication List    TAKE these medications   ferrous sulfate 325 (65 FE) MG tablet Take 1 tablet (325 mg total) by mouth daily.   ibuprofen 600 MG tablet Commonly known as:  ADVIL,MOTRIN Take 1 tablet (600 mg total) by mouth every 6 (six) hours.   medroxyPROGESTERone 150 MG/ML injection Commonly known as:  DEPO-PROVERA Inject 1 mL (150 mg total) into the muscle every 3 (three) months.   metoCLOPramide 10 MG tablet Commonly known as:  REGLAN Take 1 tablet (10 mg total) by mouth every 8 (eight) hours as needed for nausea.   prenatal multivitamin Tabs tablet Take 1 tablet by mouth daily at 12 noon.   promethazine 25 MG tablet Commonly known as:  PHENERGAN Take 1 tablet (25 mg total) by mouth every 6 (six) hours as needed for nausea or vomiting.       Diet: routine diet  Activity: Advance as tolerated. Pelvic rest for 6 weeks.   Outpatient follow up:1 week BP check and 6 weeks PPV Follow up Appt:No future appointments. Follow up Visit:No follow-ups on file.  Postpartum contraception: Depo Provera  Newborn Data: Live born female  Birth Weight: 6 lb 11.4 oz (3045 g) APGAR:  9, 9  Newborn Delivery   Birth date/time:  10/17/2017 16:43:00 Delivery type:  Vaginal, Spontaneous     Baby Feeding: mother pumping and bott feeding infant  Disposition:home with mother  Pt discharged home in stable condition    10/19/2017 Dale Centerton, FNP

## 2017-10-19 NOTE — Lactation Note (Signed)
This note was copied from a baby's chart. Lactation Consultation Note  Patient Name: Boy Lynnsey Barbara [id number] Date: 10/19/2017   Baby 45 hours old and being discharged. Mother has been primarily formula feeding.  Briefly spoke with mother prior to discharge. She states she ordered a DEBP. Provided mother with manual pump w/ instructions. Reviewed engorgement care and monitoring voids/stools. Encouraged mother to pump q 2.5-3 hours if she desires to give baby breastmilk and offer breast before formula.       Maternal Data    Feeding    LATCH Score                   Interventions    Lactation Tools Discussed/Used     Consult Status      Hardie Pulley 10/19/2017, 1:54 PM

## 2017-10-21 NOTE — Addendum Note (Signed)
Addendum  created 10/21/17 0759 by Achille Rich, MD   Intraprocedure Staff edited

## 2018-06-03 NOTE — L&D Delivery Note (Signed)
Delivery Note Patient pushed for less than 5 minutes after she was noted to be C/C/+3. At 11:27 AM a viable and healthy female was delivered through a loose nuchal cord via Vaginal, Spontaneous (Presentation:LOA ).  APGAR: 7, 9; weight 5 lb 4.8 oz (2404 g).  Cord double clamped and cut and handed off to the Nursing staff at warmer. Cord gases and cord blood obtained.   The placental cord avulsed and the placenta had to be manually removed. The placenta was removed in its entirety and a uterine sweep was performed.  There was some uterine atony which was alleviated by  1000 mcg of rectal cytotec IV pitocin and massage. There were no noted lacerations. Patient tolerated delivery well.   Placenta: 2 vessels, cord avulsion, appeared small, sent to Pathology Anesthesia: Epidural Episiotomy: None Lacerations: None Suture Repair: N/a Est. Blood Loss (mL): 500 mL  Mom to postpartum.  Baby to Couplet care / Skin to Skin.  Edan Juday, [place] 04/21/2019, 11:40 AM

## 2018-09-10 ENCOUNTER — Emergency Department (HOSPITAL_COMMUNITY)
Admission: EM | Admit: 2018-09-10 | Discharge: 2018-09-10 | Disposition: A | Discharge status: Home / Self Care | Payer: Medicaid Other | Attending: Emergency Medicine | Admitting: Emergency Medicine

## 2018-09-10 ENCOUNTER — Other Ambulatory Visit: Payer: Self-pay

## 2018-09-10 ENCOUNTER — Emergency Department (HOSPITAL_COMMUNITY): Payer: Medicaid Other

## 2018-09-10 DIAGNOSIS — O3680X0 Pregnancy with inconclusive fetal viability, not applicable or unspecified: Secondary | ICD-10-CM

## 2018-09-10 DIAGNOSIS — B9689 Other specified bacterial agents as the cause of diseases classified elsewhere: Secondary | ICD-10-CM

## 2018-09-10 DIAGNOSIS — R8271 Bacteriuria: Secondary | ICD-10-CM | POA: Diagnosis not present

## 2018-09-10 DIAGNOSIS — O9989 Other specified diseases and conditions complicating pregnancy, childbirth and the puerperium: Secondary | ICD-10-CM

## 2018-09-10 DIAGNOSIS — O99891 Other specified diseases and conditions complicating pregnancy: Secondary | ICD-10-CM

## 2018-09-10 DIAGNOSIS — R1030 Lower abdominal pain, unspecified: Secondary | ICD-10-CM

## 2018-09-10 DIAGNOSIS — N760 Acute vaginitis: Secondary | ICD-10-CM | POA: Diagnosis not present

## 2018-09-10 DIAGNOSIS — Z3201 Encounter for pregnancy test, result positive: Secondary | ICD-10-CM | POA: Insufficient documentation

## 2018-09-10 DIAGNOSIS — J45909 Unspecified asthma, uncomplicated: Secondary | ICD-10-CM | POA: Diagnosis not present

## 2018-09-10 LAB — CBC WITH DIFFERENTIAL/PLATELET
Abs Immature Granulocytes: 0.02 10*3/uL (ref 0.00–0.07)
Basophils Absolute: 0 10*3/uL (ref 0.0–0.1)
Basophils Relative: 1 %
Eosinophils Absolute: 0.3 10*3/uL (ref 0.0–0.5)
Eosinophils Relative: 3 %
HCT: 38.8 % (ref 36.0–46.0)
Hemoglobin: 12.8 g/dL (ref 12.0–15.0)
Immature Granulocytes: 0 %
Lymphocytes Relative: 23 %
Lymphs Abs: 1.8 10*3/uL (ref 0.7–4.0)
MCH: 28.4 pg (ref 26.0–34.0)
MCHC: 33 g/dL (ref 30.0–36.0)
MCV: 86.2 fL (ref 80.0–100.0)
Monocytes Absolute: 0.4 10*3/uL (ref 0.1–1.0)
Monocytes Relative: 4 %
Neutro Abs: 5.6 10*3/uL (ref 1.7–7.7)
Neutrophils Relative %: 69 %
Platelets: 325 10*3/uL (ref 150–400)
RBC: 4.5 MIL/uL (ref 3.87–5.11)
RDW: 12 % (ref 11.5–15.5)
WBC: 8.1 10*3/uL (ref 4.0–10.5)
nRBC: 0 % (ref 0.0–0.2)

## 2018-09-10 LAB — COMPREHENSIVE METABOLIC PANEL
ALT: 10 U/L (ref 0–44)
AST: 15 U/L (ref 15–41)
Albumin: 4 g/dL (ref 3.5–5.0)
Alkaline Phosphatase: 66 U/L (ref 38–126)
Anion gap: 9 (ref 5–15)
BUN: 7 mg/dL (ref 6–20)
CO2: 23 mmol/L (ref 22–32)
Calcium: 9.4 mg/dL (ref 8.9–10.3)
Chloride: 107 mmol/L (ref 98–111)
Creatinine, Ser: 0.73 mg/dL (ref 0.44–1.00)
GFR calc Af Amer: >60 mL/min (ref >60)
GFR calc non Af Amer: >60 mL/min (ref >60)
Glucose, Bld: 95 mg/dL (ref 70–99)
Potassium: 3.6 mmol/L (ref 3.5–5.1)
Sodium: 139 mmol/L (ref 135–145)
Total Bilirubin: 0.6 mg/dL (ref 0.3–1.2)
Total Protein: 7.2 g/dL (ref 6.5–8.1)

## 2018-09-10 LAB — URINALYSIS, ROUTINE W REFLEX MICROSCOPIC
Bilirubin Urine: NEGATIVE
Glucose, UA: NEGATIVE mg/dL
Hgb urine dipstick: NEGATIVE
Ketones, ur: 5 mg/dL — AB
Leukocytes,Ua: NEGATIVE
Nitrite: NEGATIVE
Protein, ur: 30 mg/dL — AB
Specific Gravity, Urine: 1.039 — ABNORMAL HIGH (ref 1.005–1.030)
pH: 7 (ref 5.0–8.0)

## 2018-09-10 LAB — WET PREP, GENITAL
Sperm: NONE SEEN
Trich, Wet Prep: NONE SEEN
Yeast Wet Prep HPF POC: NONE SEEN

## 2018-09-10 LAB — I-STAT BETA HCG BLOOD, ED (MC, WL, AP ONLY): I-stat hCG, quantitative: 241.2 m[IU]/mL — ABNORMAL HIGH (ref <5)

## 2018-09-10 LAB — LIPASE, BLOOD: Lipase: 40 U/L (ref 11–51)

## 2018-09-10 MED ORDER — PRENATAL COMPLETE 14-0.4 MG PO TABS: 1.0000 | ORAL_TABLET | Freq: Every day | ORAL | 0 refills | Status: AC

## 2018-09-10 MED ORDER — METRONIDAZOLE 500 MG PO TABS: 500.0000 mg | ORAL_TABLET | Freq: Two times a day (BID) | ORAL | 0 refills | Status: DC

## 2018-09-10 MED ORDER — CEPHALEXIN 500 MG PO CAPS: 500.0000 mg | ORAL_CAPSULE | Freq: Two times a day (BID) | ORAL | 0 refills | Status: AC

## 2018-09-10 MED ORDER — SODIUM CHLORIDE 0.9 % IV BOLUS
1000.0000 mL | Freq: Once | INTRAVENOUS | Status: AC
  Administered 2018-09-10: 1000 mL via INTRAVENOUS

## 2018-09-10 MED ORDER — ACETAMINOPHEN 500 MG PO TABS
1000.0000 mg | ORAL_TABLET | Freq: Once | ORAL | Status: AC
  Administered 2018-09-10: 19:00:00 1000 mg via ORAL
  Filled 2018-09-10: qty 2

## 2018-09-10 NOTE — ED Notes (Signed)
Patient verbalizes understanding of discharge instructions. Opportunity for questioning and answers were provided. Armband removed by staff, pt discharged from ED ambulatory to home.  

## 2018-09-10 NOTE — ED Triage Notes (Signed)
Pt reports abdominal pain and back pain that began last month. Pt has been taking tylenol and ibuprofen with relief, but then pain returns. Pt reports vomiting on 4/5 but no recurrence. Pt denies nausea.

## 2018-09-10 NOTE — Discharge Instructions (Signed)
Your pregnancy test was positive today and this appears to be a very early early pregnancy, it was too early to see on ultrasound.  You will need to follow-up with OB/GYN or at the [facility]U in 2 to 3 days for repeat hCG level this will help [place] determine if the pregnancy is healthy and growing at a normal rate.  Your wet prep showed evidence of bacterial vaginosis, please take Flagyl twice daily to treat this, your urine also had some bacteria, please take Keflex for this.  You have STD testing pending you will be contacted in 2 to 3 days with any positive results, if so please let your partner know.  You will need to follow-up with OB/GYN for routine care, please begin taking prenatal vitamins.  Follow-up at the MAU if you have large amount of vaginal bleeding, increasing abdominal pain, persistent vomiting or any other new or concerning symptoms.

## 2018-09-10 NOTE — ED Provider Notes (Signed)
[facility] EMERGENCY DEPARTMENT Provider Note   CSN: [id number] Arrival date & time: 09/10/18  1621    History   Chief Complaint Chief Complaint  Patient presents with  . Abdominal Pain    HPI Sylvia Chambers is a 23 y.o. female.     Sylvia Chambers is a 23 y.o. female with a history of asthma and preeclampsia, who presents to the emergency department for evaluation of intermittent lower abdominal pain pain.  Patient reports pain has been occurring over the past month.  She reports pains come and go they are primarily located over the central lower abdomen and suprapubic area.  She also reports she has some pain that radiates into her mid back intermittently.  Pain is associated with some mild nausea.  She had one episode of vomiting several days ago and has had no further vomiting.  Denies any blood in the stools, no constipation or diarrhea.  She has not had any fevers or chills.  No burning pain or discomfort with urination.  She has had some occasional vaginal discharge and did have a small amount of vaginal bleeding today.  Reports her last menstrual period was on 08/05/2018.  And she felt that her cycle may be starting again today.  Reports she is sexually active with one partner and uses condoms for protection but is not on any other birth control.  Does not think she could currently be pregnant.  Does not think she has been exposed to any STDs.  Denies associated chest pain or shortness of breath.  No cough.  No known sick contacts.     Past Medical History:  Diagnosis Date  . Asthma     Patient Active Problem List   Diagnosis Date Noted  . Preeclampsia 10/16/2017    Past Surgical History:  Procedure Laterality Date  . EYE SURGERY       OB History    Gravida  1   Para  1   Term  1   Preterm      AB      Living  1     SAB      TAB      Ectopic      Multiple  0   Live Births  1            Home Medications    Prior to  Admission medications   Medication Sig Start Date End Date Taking? Authorizing Provider  ferrous sulfate 325 (65 FE) MG tablet Take 1 tablet (325 mg total) by mouth daily. 10/19/17 10/19/18  Dale Kranzburg, FNP  ibuprofen (ADVIL,MOTRIN) 600 MG tablet Take 1 tablet (600 mg total) by mouth every 6 (six) hours. 10/19/17   Dale Gerster, FNP  medroxyPROGESTERone (DEPO-PROVERA) 150 MG/ML injection Inject 1 mL (150 mg total) into the muscle every 3 (three) months. 10/19/17   Dale Scotsdale, FNP  metoCLOPramide (REGLAN) 10 MG tablet Take 1 tablet (10 mg total) by mouth every 8 (eight) hours as needed for nausea. Patient not taking: Reported on 10/17/2017 03/28/17   Marny Lowenstein, PA-C  Prenatal Vit-Fe Fumarate-FA (PRENATAL MULTIVITAMIN) TABS tablet Take 1 tablet by mouth daily at 12 noon.    [provider]  promethazine (PHENERGAN) 25 MG tablet Take 1 tablet (25 mg total) by mouth every 6 (six) hours as needed for nausea or vomiting. Patient not taking: Reported on 10/17/2017 03/05/17   Judeth Horn, NP    Family History Family History  Problem  Relation Age of Onset  . Hypertension Mother     Social History Social History   Tobacco Use  . Smoking status: Never Smoker  . Smokeless tobacco: Never Used  Substance Use Topics  . Alcohol use: No  . Drug use: Not Currently    Types: Other-see comments    Comment: black and milds     Allergies   Patient has no known allergies.   Review of Systems Review of Systems  Constitutional: Negative for chills and fever.  HENT: Negative.   Eyes: Negative for visual disturbance.  Respiratory: Negative for cough and shortness of breath.   Cardiovascular: Negative for chest pain.  Gastrointestinal: Positive for abdominal pain, nausea and vomiting. Negative for blood in stool, constipation and diarrhea.  Genitourinary: Positive for vaginal bleeding and vaginal discharge. Negative for dysuria, flank pain, frequency, genital sores and pelvic pain.   Musculoskeletal: Positive for back pain.  Skin: Negative for color change and rash.  Neurological: Negative for dizziness, syncope and light-headedness.     Physical Exam Updated Vital Signs BP 138/79 (BP Location: Right Arm)   Pulse 95   Resp 18   Ht 5\' 3"  (1.6 m)   Wt 87.1 kg   SpO2 100%   BMI 34.01 kg/m   Physical Exam Vitals signs and nursing note reviewed. Exam conducted with a chaperone present.  Constitutional:      General: She is not in acute distress.    Appearance: She is well-developed and normal weight. She is not ill-appearing or diaphoretic.  HENT:     Head: Normocephalic and atraumatic.  Eyes:     General:        Right eye: No discharge.        Left eye: No discharge.     Pupils: Pupils are equal, round, and reactive to light.  Neck:     Musculoskeletal: Neck supple.  Cardiovascular:     Rate and Rhythm: Normal rate and regular rhythm.     Heart sounds: Normal heart sounds.  Pulmonary:     Effort: Pulmonary effort is normal. No respiratory distress.     Breath sounds: Normal breath sounds. No wheezing or rales.     Comments: Respirations equal and unlabored, patient able to speak in full sentences, lungs clear to auscultation bilaterally Abdominal:     General: Abdomen is flat. Bowel sounds are normal. There is no distension.     Palpations: Abdomen is soft. There is no mass.     Tenderness: There is abdominal tenderness in the suprapubic area. There is no right CVA tenderness, left CVA tenderness, guarding or rebound.     Comments: Abdomen soft, nondistended, nontender to palpation in all quadrants without guarding or peritoneal signs. No CVA tenderness bilaterally  Genitourinary:    Comments: Chaperone present during pelvic exam. No external genital lesions noted. Speculum exam reveals a small amount of thin gray malodorous discharge, cervix slightly erythematous, cervical os closed, no bleeding noted. With bimanual exam there is some mild tenderness  with palpation over the uterus, there is no focal adnexal tenderness or palpable masses. Musculoskeletal:        General: No deformity.  Skin:    General: Skin is warm and dry.     Capillary Refill: Capillary refill takes less than 2 seconds.  Neurological:     Mental Status: She is alert.     Coordination: Coordination normal.     Comments: Speech is clear, able to follow commands Moves extremities without ataxia,  coordination intact      ED Treatments / Results  Labs (all labs ordered are listed, but only abnormal results are displayed) Labs Reviewed  WET PREP, GENITAL - Abnormal; Notable for the following components:      Result Value   Clue Cells Wet Prep HPF POC PRESENT (*)    WBC, Wet Prep HPF POC MANY (*)    All other components within normal limits  URINALYSIS, ROUTINE W REFLEX MICROSCOPIC - Abnormal; Notable for the following components:   Specific Gravity, Urine 1.039 (*)    Ketones, ur 5 (*)    Protein, ur 30 (*)    Bacteria, UA FEW (*)    All other components within normal limits  I-STAT BETA HCG BLOOD, ED ([facility], [facility], [facility] ONLY) - Abnormal; Notable for the following components:   I-stat hCG, quantitative 241.2 (*)    All other components within normal limits  RPR  HIV ANTIBODY (ROUTINE TESTING W REFLEX)  CBC WITH DIFFERENTIAL/PLATELET  COMPREHENSIVE METABOLIC PANEL  LIPASE, BLOOD  GC/CHLAMYDIA PROBE AMP ([place]) NOT AT [facility]    EKG None  Radiology US Ob Comp < 14 Wks  Result Date: 09/10/2018 CLINICAL DATA:  Initial evaluation for acute lower abdominal pain, early pregnancy. EXAM: OBSTETRIC <14 WK [place] AND TRANSVAGINAL OB US TECHNIQUE: Both transabdominal and transvaginal ultrasound examinations were performed for complete evaluation of the gestation as well as the maternal uterus, adnexal regions, and pelvic cul-de-sac. Transvaginal technique was performed to assess early pregnancy. COMPARISON:  None. FINDINGS: Intrauterine gestational sac: Negative. Yolk sac:   Negative. Embryo:  Negative. Cardiac Activity: N/A Heart Rate: N/A  bpm Subchorionic hemorrhage:  None visualized. Maternal uterus/adnexae: Left ovary normal in appearance. 2 cm simple physiologic follicular cyst/dominant follicle noted within the right ovary. Adjacent mildly complex 2 cm cyst noted as well, likely a small corpus luteal cyst and/or complex physiologic cyst. No free fluid within the pelvis. Well-circumscribed ovoid hypoechoic lesion within the central aspect of the uterus measures 2.3 x 2.1 cm, suspected to reflect a submucosal fibroid. Mild mass effect on the adjacent endometrial stripe which is displaced. IMPRESSION: 1. Early pregnancy with no discrete IUP or adnexal mass identified. Finding is consistent with a pregnancy of unknown anatomic location. Differential considerations include IUP to early to visualize, recent SAB, or possibly occult ectopic pregnancy. Close clinical monitoring with serial beta HCGs and close interval follow-up ultrasound recommended as clinically warranted. 2. No other acute maternal uterine or adnexal abnormality identified. 3. Probable 2.3 cm submucosal fibroid. Electronically Signed   By: Rise Mu M.D.   On: 09/10/2018 19:31    Procedures Procedures (including critical care time)  Medications Ordered in ED Medications  sodium chloride 0.9 % bolus 1,000 mL (has no administration in time range)  acetaminophen (TYLENOL) tablet 1,000 mg (has no administration in time range)     Initial Impression / Assessment and Plan / ED Course  I have reviewed the triage vital signs and the nursing notes.  Pertinent labs & imaging results that were available during my care of the patient were reviewed by me and considered in my medical decision making (see chart for details).  Patient presents with lower abdominal pain with some low back pain as well.  Symptoms come and go, they are temporary relief with over-the-counter pain medications but then returned.   Associated nausea with one episode of vomiting, no abdominal bowel symptoms or urinary symptoms.  Patient has had some intermittent vaginal discharge, LMP 08/05/2018, and started  to have a small amount of bleeding today which she described as spotting.  Unsure if she could be pregnant, does not think she could have an STD.  On exam patient has mild tenderness across the lower abdomen with no focal guarding, no signs of an acute surgical abdomen.  Pelvic exam reveals small amount of vaginal discharge, no active bleeding and no focal adnexal masses, does have some midline uterine tenderness.  Will check abdominal labs and pregnancy test and plan for pelvic ultrasound.  STD testing collected as well.  Patient's pregnancy test is positive with hCG level of 241.2, suggesting early pregnancy, labs otherwise reassuring, no leukocytosis, normal hemoglobin, no acute electrolyte derangements, normal renal liver function and normal lipase.  Urinalysis with some bacteria, no other signs of infection, wet prep shows clue cells and white blood cells suggestive of BV which is also consistent with patient's exam.  STD testing pending and will have patient follow-up with OB/GYN regarding this.  Given positive pregnancy test and small amount of bleeding today we will proceed with early obstetric ultrasound.  Despite bleeding, blood Type: B +, no rhogam needed.  Ultrasound does not show any obvious IUP, but does not show any evidence of an ectopic either and there is no free fluid in the pelvis, at this time ultrasound consistent with pregnancy of unknown location, serial beta hCG levels and repeat ultrasound recommended.  I have discussed this with the patient, she will be treated for BV and asymptomatic bacteriuria of pregnancy and I will have her follow-up with OB/GYN in 2 days for repeat hCG level and OB/GYN follow-up.  Encouraged her to begin taking prenatal vitamins.  Return precautions discussed.  Patient expresses  understanding and agreement with plan, discharged home in good condition.  Final Clinical Impressions(s) / ED Diagnoses   Final diagnoses:  Pregnancy of unknown anatomic location  BV (bacterial vaginosis)  Asymptomatic bacteriuria during pregnancy  Lower abdominal pain    ED Discharge Orders    None       Dartha Lodge, [place] 09/16/18 1149    Tegeler, Canary Brim, MD 09/17/18 1506

## 2018-09-11 LAB — RPR: RPR Ser Ql: NONREACTIVE

## 2018-09-11 LAB — HIV ANTIBODY (ROUTINE TESTING W REFLEX): HIV Screen 4th Generation wRfx: NONREACTIVE

## 2018-09-14 ENCOUNTER — Other Ambulatory Visit: Payer: Self-pay

## 2018-09-14 ENCOUNTER — Inpatient Hospital Stay (HOSPITAL_COMMUNITY): Payer: Medicaid Other

## 2018-09-14 ENCOUNTER — Inpatient Hospital Stay (HOSPITAL_COMMUNITY)
Admission: AD | Admit: 2018-09-14 | Discharge: 2018-09-14 | Disposition: A | Discharge status: Home / Self Care | Payer: Medicaid Other | Source: Ambulatory Visit | Attending: Obstetrics and Gynecology | Admitting: Obstetrics and Gynecology

## 2018-09-14 ENCOUNTER — Encounter (HOSPITAL_COMMUNITY): Payer: Self-pay | Admitting: *Deleted

## 2018-09-14 DIAGNOSIS — O209 Hemorrhage in early pregnancy, unspecified: Secondary | ICD-10-CM | POA: Diagnosis not present

## 2018-09-14 DIAGNOSIS — R1032 Left lower quadrant pain: Secondary | ICD-10-CM | POA: Insufficient documentation

## 2018-09-14 DIAGNOSIS — Z792 Long term (current) use of antibiotics: Secondary | ICD-10-CM | POA: Insufficient documentation

## 2018-09-14 DIAGNOSIS — R109 Unspecified abdominal pain: Secondary | ICD-10-CM

## 2018-09-14 DIAGNOSIS — Z825 Family history of asthma and other chronic lower respiratory diseases: Secondary | ICD-10-CM | POA: Diagnosis not present

## 2018-09-14 DIAGNOSIS — O3481 Maternal care for other abnormalities of pelvic organs, first trimester: Secondary | ICD-10-CM | POA: Insufficient documentation

## 2018-09-14 DIAGNOSIS — O26891 Other specified pregnancy related conditions, first trimester: Secondary | ICD-10-CM | POA: Diagnosis not present

## 2018-09-14 DIAGNOSIS — O99331 Smoking (tobacco) complicating pregnancy, first trimester: Secondary | ICD-10-CM | POA: Insufficient documentation

## 2018-09-14 DIAGNOSIS — R1031 Right lower quadrant pain: Secondary | ICD-10-CM | POA: Insufficient documentation

## 2018-09-14 DIAGNOSIS — Z791 Long term (current) use of non-steroidal anti-inflammatories (NSAID): Secondary | ICD-10-CM | POA: Diagnosis not present

## 2018-09-14 DIAGNOSIS — Z793 Long term (current) use of hormonal contraceptives: Secondary | ICD-10-CM | POA: Insufficient documentation

## 2018-09-14 DIAGNOSIS — F17290 Nicotine dependence, other tobacco product, uncomplicated: Secondary | ICD-10-CM | POA: Diagnosis not present

## 2018-09-14 DIAGNOSIS — O99511 Diseases of the respiratory system complicating pregnancy, first trimester: Secondary | ICD-10-CM | POA: Insufficient documentation

## 2018-09-14 DIAGNOSIS — O9989 Other specified diseases and conditions complicating pregnancy, childbirth and the puerperium: Secondary | ICD-10-CM | POA: Insufficient documentation

## 2018-09-14 DIAGNOSIS — Z3A01 Less than 8 weeks gestation of pregnancy: Secondary | ICD-10-CM | POA: Diagnosis not present

## 2018-09-14 DIAGNOSIS — N83201 Unspecified ovarian cyst, right side: Secondary | ICD-10-CM | POA: Insufficient documentation

## 2018-09-14 DIAGNOSIS — Z8759 Personal history of other complications of pregnancy, childbirth and the puerperium: Secondary | ICD-10-CM | POA: Diagnosis not present

## 2018-09-14 DIAGNOSIS — O3680X0 Pregnancy with inconclusive fetal viability, not applicable or unspecified: Secondary | ICD-10-CM | POA: Diagnosis present

## 2018-09-14 DIAGNOSIS — J45909 Unspecified asthma, uncomplicated: Secondary | ICD-10-CM | POA: Diagnosis not present

## 2018-09-14 HISTORY — DX: Gestational (pregnancy-induced) hypertension without significant proteinuria, unspecified trimester: O13.9

## 2018-09-14 HISTORY — DX: Unspecified infectious disease: B99.9

## 2018-09-14 LAB — URINALYSIS, ROUTINE W REFLEX MICROSCOPIC
Bilirubin Urine: NEGATIVE
Glucose, UA: NEGATIVE mg/dL
Ketones, ur: NEGATIVE mg/dL
Nitrite: NEGATIVE
Protein, ur: 30 mg/dL — AB
Specific Gravity, Urine: 1.034 — ABNORMAL HIGH (ref 1.005–1.030)
WBC, UA: >50 WBC/hpf — ABNORMAL HIGH (ref 0–5)
pH: 5 (ref 5.0–8.0)

## 2018-09-14 LAB — CBC WITH DIFFERENTIAL/PLATELET
Abs Immature Granulocytes: 0.02 10*3/uL (ref 0.00–0.07)
Basophils Absolute: 0 10*3/uL (ref 0.0–0.1)
Basophils Relative: 0 %
Eosinophils Absolute: 0.2 10*3/uL (ref 0.0–0.5)
Eosinophils Relative: 3 %
HCT: 37.8 % (ref 36.0–46.0)
Hemoglobin: 12.7 g/dL (ref 12.0–15.0)
Immature Granulocytes: 0 %
Lymphocytes Relative: 27 %
Lymphs Abs: 1.9 10*3/uL (ref 0.7–4.0)
MCH: 28.8 pg (ref 26.0–34.0)
MCHC: 33.6 g/dL (ref 30.0–36.0)
MCV: 85.7 fL (ref 80.0–100.0)
Monocytes Absolute: 0.4 10*3/uL (ref 0.1–1.0)
Monocytes Relative: 6 %
Neutro Abs: 4.5 10*3/uL (ref 1.7–7.7)
Neutrophils Relative %: 64 %
Platelets: 329 10*3/uL (ref 150–400)
RBC: 4.41 MIL/uL (ref 3.87–5.11)
RDW: 12 % (ref 11.5–15.5)
WBC: 7.1 10*3/uL (ref 4.0–10.5)
nRBC: 0 % (ref 0.0–0.2)

## 2018-09-14 LAB — GC/CHLAMYDIA PROBE AMP (~~LOC~~) NOT AT ARMC
Chlamydia: NEGATIVE
Neisseria Gonorrhea: NEGATIVE

## 2018-09-14 LAB — WET PREP, GENITAL
Clue Cells Wet Prep HPF POC: NONE SEEN
Sperm: NONE SEEN
Trich, Wet Prep: NONE SEEN
Yeast Wet Prep HPF POC: NONE SEEN

## 2018-09-14 LAB — HCG, QUANTITATIVE, PREGNANCY: hCG, Beta Chain, Quant, S: 938 m[IU]/mL — ABNORMAL HIGH (ref <5)

## 2018-09-14 NOTE — MAU Note (Signed)
Been cramping and spotting, went to other part of hospital - they told her she needed to come here.  Bled again this morning. So came in .

## 2018-09-14 NOTE — Discharge Instructions (Signed)
Ectopic Pregnancy ° °An ectopic pregnancy happens when a fertilized egg grows outside the womb (uterus). The fertilized egg cannot stay alive outside of the womb. This problem often happens in a fallopian tube. It is often caused by damage to the tube. °If this problem is found early, you may be treated with medicine that stops the egg from growing. If your tube tears or bursts open (ruptures), you will bleed inside. Often, there is very bad pain in the lower belly. This is an emergency. You will need surgery. Get help right away. °Follow these instructions at home: °After being treated with medicine or surgery: °· Rest and limit your activity for as long as told by your doctor. °· Until your doctor says that it is safe: °? Do not lift anything that is heavier than 10 lb (4.5 kg) or the limit that your doctor tells you. °? Avoid exercise and any movement that takes a lot of effort. °· To prevent problems when pooping (constipation): °? Eat a healthy diet. This includes: °§ Fruits. °§ Vegetables. °§ Whole grains. °? Drink 6-8 glasses of water a day. °Contact a doctor if: °Get help right away if: °· You have sudden and very bad pain in your belly. °· You have very bad pain in your shoulders or neck. °· You have pain that gets worse and is not helped by medicine. °· You have: °? A fever or chills. °? Vaginal bleeding. °? Redness or swelling at the site of a surgical cut (incision). °· You feel sick to your stomach (nauseous) or you throw up (vomit). °· You feel dizzy or weak. °· You feel light-headed or you pass out (faint). °Summary °· An ectopic pregnancy happens when a fertilized egg grows outside the womb (uterus). °· If this problem is found early, you may be treated with medicine that stops the egg from growing. °· If your tube tears or bursts open (ruptures), you will need surgery. This is an emergency. Get help right away. °This information is not intended to replace advice given to you by your health care  provider. Make sure you discuss any questions you have with your health care provider. °Document Released: 08/16/2008 Document Revised: 06/13/2016 Document Reviewed: 06/13/2016 °Elsevier Interactive Patient Education © 2019 [employer] ° °

## 2018-09-14 NOTE — MAU Provider Note (Addendum)
History     CSN: [id number]  Arrival date and time: 09/14/18 1701   First Provider Initiated Contact with Patient 09/14/18 1755     Chief Complaint  Patient presents with  . Abdominal Pain  . Vaginal Bleeding   HPI Sylvia Chambers is a 23 y.o. G2P1001 who presents to MAU with chief complaint of vaginal bleeding and abdominal cramping. These are recurring problems for which patient was evaluated in [facility] 09/10/2018. Patient now reports her bilateral lower abdominal pain waxes and wanes, currently rated as 8/10. Her pain does not radiate. She has not taken medication or tried other treatments for these complaints.  Patient was prescribed Keflex and Flagyl by ED Provider for UTI but has not picked up the prescription.  OB History    Gravida  2   Para  1   Term  1   Preterm      AB      Living  1     SAB      TAB      Ectopic      Multiple  0   Live Births  1           Past Medical History:  Diagnosis Date  . Asthma   . Infection    UTI  . Pregnancy induced hypertension    with first preg    Past Surgical History:  Procedure Laterality Date  . EYE SURGERY      Family History  Problem Relation Age of Onset  . Hypertension Mother   . Asthma Mother   . Healthy Father     Social History   Tobacco Use  . Smoking status: Current Every Day Smoker    Years: 2.00    Types: Cigars  . Smokeless tobacco: Never Used  . Tobacco comment: black and mild 2/day  Substance Use Topics  . Alcohol use: No  . Drug use: Never    Allergies: No Known Allergies  Medications Prior to Admission  Medication Sig Dispense Refill Last Dose  . cephALEXin (KEFLEX) 500 MG capsule Take 1 capsule (500 mg total) by mouth 2 (two) times daily for 7 days. 14 capsule 0   . ferrous sulfate 325 (65 FE) MG tablet Take 1 tablet (325 mg total) by mouth daily. 30 tablet 3   . ibuprofen (ADVIL,MOTRIN) 600 MG tablet Take 1 tablet (600 mg total) by mouth every 6 (six) hours. 30 tablet  0   . medroxyPROGESTERone (DEPO-PROVERA) 150 MG/ML injection Inject 1 mL (150 mg total) into the muscle every 3 (three) months. 1 mL 0   . metoCLOPramide (REGLAN) 10 MG tablet Take 1 tablet (10 mg total) by mouth every 8 (eight) hours as needed for nausea. 30 tablet 0 More than a month at Unknown time  . metroNIDAZOLE (FLAGYL) 500 MG tablet Take 1 tablet (500 mg total) by mouth 2 (two) times daily. One po bid x 7 days 14 tablet 0   . Prenatal Vit-Fe Fumarate-FA (PRENATAL COMPLETE) 14-0.4 MG TABS Take 1 tablet by mouth daily. 30 each 0   . Prenatal Vit-Fe Fumarate-FA (PRENATAL MULTIVITAMIN) TABS tablet Take 1 tablet by mouth daily at 12 noon.   Past Week at Unknown time  . promethazine (PHENERGAN) 25 MG tablet Take 1 tablet (25 mg total) by mouth every 6 (six) hours as needed for nausea or vomiting. 30 tablet 0 More than a month at Unknown time    Review of Systems  Constitutional: Negative for chills, fatigue  and fever.  Gastrointestinal: Positive for abdominal pain.  Genitourinary: Positive for vaginal bleeding. Negative for difficulty urinating, dysuria, flank pain and pelvic pain.  Musculoskeletal: Negative for back pain.  All other systems reviewed and are negative.  Physical Exam   Blood pressure 125/79, pulse 95, temperature 98.5 F (36.9 C), temperature source Oral, resp. rate 18, height 5\' 3"  (1.6 m), weight 84.8 kg, last menstrual period 08/05/2018, SpO2 98 %, unknown if currently breastfeeding.  Physical Exam  Nursing note and vitals reviewed. Constitutional: She is oriented to person, place, and time. She appears well-developed and well-nourished.  Cardiovascular: Normal rate.  Respiratory: Effort normal. No respiratory distress.  GI: Soft. She exhibits no distension. There is no abdominal tenderness. There is no rebound, no guarding and no CVA tenderness.  Genitourinary:    Vagina and uterus normal.     No vaginal discharge.     Genitourinary Comments: No CMT, no adnexal  tenderness. Thin viscous liquid present in vaginal vault, possibly lubricant from previous exam or recent intercourse. Not organic   Neurological: She is alert and oriented to person, place, and time.  Skin: Skin is warm and dry.  Psychiatric: She has a normal mood and affect. Her behavior is normal. Judgment and thought content normal.    MAU Course  Procedures:sterile speculum exam  --ED Provider note is Incomplete, unable to establish comparison   Patient Vitals for the past 24 hrs:  BP Temp Temp src Pulse Resp SpO2 Height Weight  09/14/18 1914 126/82 - - 84 - - - -  09/14/18 1746 125/79 - - 95 - - - -  09/14/18 1724 120/76 98.5 F (36.9 C) Oral 94 18 98 % 5\' 3"  (1.6 m) 84.8 kg    Results for orders placed or performed during the hospital encounter of 09/14/18 (from the past 24 hour(s))  CBC with Differential/Platelet     Status: None   Collection Time: 09/14/18  5:36 PM  Result Value Ref Range   WBC 7.1 4.0 - 10.5 K/uL   RBC 4.41 3.87 - 5.11 MIL/uL   Hemoglobin 12.7 12.0 - 15.0 g/dL   HCT 16.137.8 09.636.0 - 04.546.0 %   MCV 85.7 80.0 - 100.0 fL   MCH 28.8 26.0 - 34.0 pg   MCHC 33.6 30.0 - 36.0 g/dL   RDW 40.912.0 81.111.5 - 91.415.5 %   Platelets 329 150 - 400 K/uL   nRBC 0.0 0.0 - 0.2 %   Neutrophils Relative % 64 %   Neutro Abs 4.5 1.7 - 7.7 K/uL   Lymphocytes Relative 27 %   Lymphs Abs 1.9 0.7 - 4.0 K/uL   Monocytes Relative 6 %   Monocytes Absolute 0.4 0.1 - 1.0 K/uL   Eosinophils Relative 3 %   Eosinophils Absolute 0.2 0.0 - 0.5 K/uL   Basophils Relative 0 %   Basophils Absolute 0.0 0.0 - 0.1 K/uL   Immature Granulocytes 0 %   Abs Immature Granulocytes 0.02 0.00 - 0.07 K/uL  hCG, quantitative, pregnancy     Status: Abnormal   Collection Time: 09/14/18  5:37 PM  Result Value Ref Range   hCG, Beta Chain, Quant, S 938 (H) <5 mIU/mL  Urinalysis, Routine w reflex microscopic     Status: Abnormal   Collection Time: 09/14/18  6:04 PM  Result Value Ref Range   Color, Urine AMBER (A)  YELLOW   APPearance CLOUDY (A) CLEAR   Specific Gravity, Urine 1.034 (H) 1.005 - 1.030   pH 5.0 5.0 -  8.0   Glucose, UA NEGATIVE NEGATIVE mg/dL   Hgb urine dipstick LARGE (A) NEGATIVE   Bilirubin Urine NEGATIVE NEGATIVE   Ketones, ur NEGATIVE NEGATIVE mg/dL   Protein, ur 30 (A) NEGATIVE mg/dL   Nitrite NEGATIVE NEGATIVE   Leukocytes,Ua LARGE (A) NEGATIVE   RBC / HPF 11-20 0 - 5 RBC/hpf   WBC, UA >50 (H) 0 - 5 WBC/hpf   Bacteria, UA MANY (A) NONE SEEN   Squamous Epithelial / LPF 21-50 0 - 5   Mucus PRESENT   Wet prep, genital     Status: Abnormal   Collection Time: 09/14/18  6:04 PM  Result Value Ref Range   Yeast Wet Prep HPF POC NONE SEEN NONE SEEN   Trich, Wet Prep NONE SEEN NONE SEEN   Clue Cells Wet Prep HPF POC NONE SEEN NONE SEEN   WBC, Wet Prep HPF POC MANY (A) NONE SEEN   Sperm NONE SEEN     US Ob Transvaginal  Result Date: 09/14/2018 CLINICAL DATA:  Cramping and vaginal bleeding with increasing beta HCG level EXAM: TRANSVAGINAL OB ULTRASOUND TECHNIQUE: Transvaginal ultrasound was performed for complete evaluation of the gestation as well as the maternal uterus, adnexal regions, and pelvic cul-de-sac. COMPARISON:  09/10/2018 FINDINGS: Intrauterine gestational sac: No definitive gestational sac is identified at this time. A few small hypoechoic areas are noted within the endometrial canal which may represent the most earliest of gestational sacs. Maternal uterus/adnexae: Cystic areas are again noted within the right ovary. The ovaries are otherwise within normal limits. The previously seen submucosal fibroid is not as well appreciated on today's exam. IMPRESSION: No definitive intrauterine gestational sac is noted. Clinical follow-up with serial beta HCGs is again suggested. Electronically Signed   By: Alcide Clever M.D.   On: 09/14/2018 19:23     Assessment and Plan  --23 y.o. G2P1001 [redacted]w[redacted]d by LMP with pregnancy of unknown location --Appropriate rise in quant hCG 241.2 on 04/09,  now 938 --Patient to pick up and start Flagyl and Keflex as previously prescribed in ED --Ectopic and bleeding precautions reviewed with patient --Discharge home in stable condition  F/U: Viability scan ordered for 10-14 days from today. Patient knows to expect scheduling call.  Calvert Cantor, CNM 09/14/2018, 7:43 PM

## 2018-09-15 LAB — GC/CHLAMYDIA PROBE AMP (~~LOC~~) NOT AT ARMC
Chlamydia: NEGATIVE
Neisseria Gonorrhea: NEGATIVE

## 2018-09-16 LAB — CULTURE, OB URINE

## 2018-09-21 LAB — OB RESULTS CONSOLE GC/CHLAMYDIA: Gonorrhea: NEGATIVE

## 2018-10-14 ENCOUNTER — Other Ambulatory Visit: Payer: Self-pay

## 2018-10-14 ENCOUNTER — Inpatient Hospital Stay (HOSPITAL_COMMUNITY)
Admission: AD | Admit: 2018-10-14 | Discharge: 2018-10-14 | Discharge status: Against Medical Advice | Payer: Medicaid Other | Attending: Obstetrics and Gynecology | Admitting: Obstetrics and Gynecology

## 2018-10-14 DIAGNOSIS — R109 Unspecified abdominal pain: Secondary | ICD-10-CM | POA: Diagnosis present

## 2018-10-14 DIAGNOSIS — Z5329 Procedure and treatment not carried out because of patient's decision for other reasons: Secondary | ICD-10-CM | POA: Insufficient documentation

## 2018-10-14 LAB — URINALYSIS, ROUTINE W REFLEX MICROSCOPIC
Bilirubin Urine: NEGATIVE
Glucose, UA: NEGATIVE mg/dL
Hgb urine dipstick: NEGATIVE
Ketones, ur: NEGATIVE mg/dL
Nitrite: NEGATIVE
Protein, ur: NEGATIVE mg/dL
Specific Gravity, Urine: 1.019 (ref 1.005–1.030)
pH: 7 (ref 5.0–8.0)

## 2018-10-14 NOTE — MAU Note (Signed)
Pt presents to MAU with c/o lower abdominal cramping that has been ongoing x 1 month. Pt denies VB.

## 2018-10-14 NOTE — MAU Note (Signed)
Pt not in lobby when called

## 2018-10-14 NOTE — MAU Note (Signed)
Pt called final time, not in lobby.

## 2018-10-14 NOTE — MAU Note (Signed)
Pt called second time, not in lobby.

## 2018-11-03 LAB — OB RESULTS CONSOLE RPR: RPR: NONREACTIVE

## 2018-11-03 LAB — OB RESULTS CONSOLE ABO/RH: RH Type: POSITIVE

## 2018-11-03 LAB — OB RESULTS CONSOLE GC/CHLAMYDIA
Chlamydia: NEGATIVE
Gonorrhea: NEGATIVE

## 2018-11-03 LAB — OB RESULTS CONSOLE HIV ANTIBODY (ROUTINE TESTING): HIV: NONREACTIVE

## 2018-11-03 LAB — OB RESULTS CONSOLE ANTIBODY SCREEN: Antibody Screen: NEGATIVE

## 2018-11-03 LAB — OB RESULTS CONSOLE HEPATITIS B SURFACE ANTIGEN: Hepatitis B Surface Ag: NEGATIVE

## 2018-11-03 LAB — OB RESULTS CONSOLE RUBELLA ANTIBODY, IGM: Rubella: IMMUNE

## 2019-01-01 ENCOUNTER — Other Ambulatory Visit: Payer: Self-pay

## 2019-01-01 ENCOUNTER — Encounter (HOSPITAL_COMMUNITY): Payer: Self-pay | Admitting: *Deleted

## 2019-01-01 ENCOUNTER — Inpatient Hospital Stay (HOSPITAL_COMMUNITY)
Admission: AD | Admit: 2019-01-01 | Discharge: 2019-01-01 | Disposition: A | Discharge status: Home / Self Care | Payer: Medicaid Other | Attending: Obstetrics & Gynecology | Admitting: Obstetrics & Gynecology

## 2019-01-01 DIAGNOSIS — N898 Other specified noninflammatory disorders of vagina: Secondary | ICD-10-CM | POA: Diagnosis present

## 2019-01-01 DIAGNOSIS — O98812 Other maternal infectious and parasitic diseases complicating pregnancy, second trimester: Secondary | ICD-10-CM | POA: Insufficient documentation

## 2019-01-01 DIAGNOSIS — B373 Candidiasis of vulva and vagina: Secondary | ICD-10-CM | POA: Diagnosis not present

## 2019-01-01 DIAGNOSIS — Z3A21 21 weeks gestation of pregnancy: Secondary | ICD-10-CM

## 2019-01-01 DIAGNOSIS — F17290 Nicotine dependence, other tobacco product, uncomplicated: Secondary | ICD-10-CM | POA: Insufficient documentation

## 2019-01-01 DIAGNOSIS — O99332 Smoking (tobacco) complicating pregnancy, second trimester: Secondary | ICD-10-CM | POA: Insufficient documentation

## 2019-01-01 DIAGNOSIS — B3731 Acute candidiasis of vulva and vagina: Secondary | ICD-10-CM

## 2019-01-01 LAB — URINALYSIS, ROUTINE W REFLEX MICROSCOPIC
Bilirubin Urine: NEGATIVE
Glucose, UA: NEGATIVE mg/dL
Hgb urine dipstick: NEGATIVE
Ketones, ur: 20 mg/dL — AB
Nitrite: NEGATIVE
Protein, ur: 30 mg/dL — AB
Specific Gravity, Urine: 1.026 (ref 1.005–1.030)
pH: 6 (ref 5.0–8.0)

## 2019-01-01 LAB — WET PREP, GENITAL
Clue Cells Wet Prep HPF POC: NONE SEEN
Sperm: NONE SEEN
Trich, Wet Prep: NONE SEEN

## 2019-01-01 MED ORDER — TERCONAZOLE 0.4 % VA CREA: 1.0000 | TOPICAL_CREAM | Freq: Every day | VAGINAL | 0 refills | Status: DC

## 2019-01-01 NOTE — MAU Provider Note (Signed)
History     CSN: [id number]  Arrival date and time: 01/01/19 1816   First Provider Initiated Contact with Patient 01/01/19 1929      Chief Complaint  Patient presents with  . Abdominal Pain  . Vaginal Discharge   23 y.o. G2P1001 @21 .2 wks presenting with mucous discharge. Reports seeing mucous discharge from vagina 3 days ago after voiding. Denies VB and LOF. No cramping or ctx. No discharge since. No recent sex. No vaginal itching or odor.   OB History    Gravida  2   Para  1   Term  1   Preterm      AB      Living  1     SAB      TAB      Ectopic      Multiple  0   Live Births  1           Past Medical History:  Diagnosis Date  . Asthma   . Infection    UTI  . Pregnancy induced hypertension    with first preg    Past Surgical History:  Procedure Laterality Date  . EYE SURGERY      Family History  Problem Relation Age of Onset  . Hypertension Mother   . Asthma Mother   . Healthy Father     Social History   Tobacco Use  . Smoking status: Current Every Day Smoker    Years: 2.00    Types: Cigars  . Smokeless tobacco: Never Used  . Tobacco comment: black and mild 2/day  Substance Use Topics  . Alcohol use: No  . Drug use: Never    Allergies: No Known Allergies  Medications Prior to Admission  Medication Sig Dispense Refill Last Dose  . Prenatal Vit-Fe Fumarate-FA (PRENATAL COMPLETE) 14-0.4 MG TABS Take 1 tablet by mouth daily. 30 each 0 Past Week at Unknown time  . ferrous sulfate 325 (65 FE) MG tablet Take 1 tablet (325 mg total) by mouth daily. 30 tablet 3   . medroxyPROGESTERone (DEPO-PROVERA) 150 MG/ML injection Inject 1 mL (150 mg total) into the muscle every 3 (three) months. 1 mL 0 More than a month at Unknown time  . metoCLOPramide (REGLAN) 10 MG tablet Take 1 tablet (10 mg total) by mouth every 8 (eight) hours as needed for nausea. 30 tablet 0 none  . metroNIDAZOLE (FLAGYL) 500 MG tablet Take 1 tablet (500 mg total) by  mouth 2 (two) times daily. One po bid x 7 days 14 tablet 0 More than a month at Unknown time  . Prenatal Vit-Fe Fumarate-FA (PRENATAL MULTIVITAMIN) TABS tablet Take 1 tablet by mouth daily at 12 noon.     . promethazine (PHENERGAN) 25 MG tablet Take 1 tablet (25 mg total) by mouth every 6 (six) hours as needed for nausea or vomiting. 30 tablet 0 More than a month at Unknown time    Review of Systems  Gastrointestinal: Negative for abdominal pain, constipation and diarrhea.  Genitourinary: Positive for vaginal discharge. Negative for dysuria, frequency, urgency and vaginal bleeding.   Physical Exam   Blood pressure 127/75, pulse (!) 101, temperature 98.6 F (37 C), temperature source Oral, resp. rate 17, weight 86.1 kg, last menstrual period 08/05/2018, SpO2 100 %, unknown if currently breastfeeding.  Physical Exam  Constitutional: She is oriented to person, place, and time. She appears well-developed and well-nourished. No distress.  HENT:  Head: Normocephalic and atraumatic.  Neck: Normal range of motion.  Cardiovascular: Normal rate.  Respiratory: Effort normal. No respiratory distress.  GI: Soft. She exhibits no distension. There is no abdominal tenderness.  gravid  Genitourinary:    Genitourinary Comments: External: no lesions or erythema Vagina: rugated, pink, moist, thick curdy yellow adherent discharge Cervix closed/long    Musculoskeletal: Normal range of motion.  Neurological: She is alert and oriented to person, place, and time.  Skin: Skin is warm and dry.  Psychiatric: She has a normal mood and affect.  FHT 162  Results for orders placed or performed during the hospital encounter of 01/01/19 (from the past 24 hour(s))  Urinalysis, Routine w reflex microscopic     Status: Abnormal   Collection Time: 01/01/19  6:32 PM  Result Value Ref Range   Color, Urine YELLOW YELLOW   APPearance CLOUDY (A) CLEAR   Specific Gravity, Urine 1.026 1.005 - 1.030   pH 6.0 5.0 - 8.0    Glucose, UA NEGATIVE NEGATIVE mg/dL   Hgb urine dipstick NEGATIVE NEGATIVE   Bilirubin Urine NEGATIVE NEGATIVE   Ketones, ur 20 (A) NEGATIVE mg/dL   Protein, ur 30 (A) NEGATIVE mg/dL   Nitrite NEGATIVE NEGATIVE   Leukocytes,Ua LARGE (A) NEGATIVE   RBC / HPF 0-5 0 - 5 RBC/hpf   WBC, UA 0-5 0 - 5 WBC/hpf   Bacteria, UA RARE (A) NONE SEEN   Squamous Epithelial / LPF 0-5 0 - 5   Mucus PRESENT   Wet prep, genital     Status: Abnormal   Collection Time: 01/01/19  7:40 PM   Specimen: Vaginal  Result Value Ref Range   Yeast Wet Prep HPF POC PRESENT (A) NONE SEEN   Trich, Wet Prep NONE SEEN NONE SEEN   Clue Cells Wet Prep HPF POC NONE SEEN NONE SEEN   WBC, Wet Prep HPF POC MANY (A) NONE SEEN   Sperm NONE SEEN    MAU Course  Procedures  MDM Labs ordered and reviewed. No evidence of PTL or impending delivery. Pt reassured. Will treat yeast vaginitis. Stable for discharge home.  Assessment and Plan   1. [redacted] weeks gestation of pregnancy   2. Yeast vaginitis    Discharge home Follow up at [facility] as scheduled Rx Terazol PTL precautions  Allergies as of 01/01/2019   No Known Allergies     Medication List    STOP taking these medications   ferrous sulfate 325 (65 FE) MG tablet   medroxyPROGESTERone 150 MG/ML injection Commonly known as: Depo-Provera   metoCLOPramide 10 MG tablet Commonly known as: REGLAN   metroNIDAZOLE 500 MG tablet Commonly known as: Flagyl   prenatal multivitamin Tabs tablet   promethazine 25 MG tablet Commonly known as: PHENERGAN     TAKE these medications   Prenatal Complete 14-0.4 MG Tabs Take 1 tablet by mouth daily.   terconazole 0.4 % vaginal cream Commonly known as: TERAZOL 7 Place 1 applicator vaginally at bedtime.       Julianne Handler, CNM 01/01/2019, 8:12 PM

## 2019-01-01 NOTE — MAU Note (Signed)
Having some pain in lower abd, "only really do it when he moves".  When she went to the bathroom today, something came out, clear and slimy. Showed her mom, she thought it was her mucous plug.  Called the office, they were closed and they told her to come here.

## 2019-01-01 NOTE — Discharge Instructions (Signed)
Vaginal Yeast infection, Adult  Vaginal yeast infection is a condition that causes vaginal discharge as well as soreness, swelling, and redness (inflammation) of the vagina. This is a common condition. Some women get this infection frequently. What are the causes? This condition is caused by a change in the normal balance of the yeast (candida) and bacteria that live in the vagina. This change causes an overgrowth of yeast, which causes the inflammation. What increases the risk? The condition is more likely to develop in women who:  Take antibiotic medicines.  Have diabetes.  Take birth control pills.  Are pregnant.  Douche often.  Have a weak body defense system (immune system).  Have been taking steroid medicines for a long time.  Frequently wear tight clothing. What are the signs or symptoms? Symptoms of this condition include:  White, thick, creamy vaginal discharge.  Swelling, itching, redness, and irritation of the vagina. The lips of the vagina (vulva) may be affected as well.  Pain or a burning feeling while urinating.  Pain during sex. How is this diagnosed? This condition is diagnosed based on:  Your medical history.  A physical exam.  A pelvic exam. Your health care provider will examine a sample of your vaginal discharge under a microscope. Your health care provider may send this sample for testing to confirm the diagnosis. How is this treated? This condition is treated with medicine. Medicines may be over-the-counter or prescription. You may be told to use one or more of the following:  Medicine that is taken by mouth (orally).  Medicine that is applied as a cream (topically).  Medicine that is inserted directly into the vagina (suppository). Follow these instructions at home:  Lifestyle  Do not have sex until your health care provider approves. Tell your sex partner that you have a yeast infection. That person should go to his or her health care  provider and ask if they should also be treated.  Do not wear tight clothes, such as pantyhose or tight pants.  Wear breathable cotton underwear. General instructions  Take or apply over-the-counter and prescription medicines only as told by your health care provider.  Eat more yogurt. This may help to keep your yeast infection from returning.  Do not use tampons until your health care provider approves.  Try taking a sitz bath to help with discomfort. This is a warm water bath that is taken while you are sitting down. The water should only come up to your hips and should cover your buttocks. Do this 3-4 times per day or as told by your health care provider.  Do not douche.  If you have diabetes, keep your blood sugar levels under control.  Keep all follow-up visits as told by your health care provider. This is important. Contact a health care provider if:  You have a fever.  Your symptoms go away and then return.  Your symptoms do not get better with treatment.  Your symptoms get worse.  You have new symptoms.  You develop blisters in or around your vagina.  You have blood coming from your vagina and it is not your menstrual period.  You develop pain in your abdomen. Summary  Vaginal yeast infection is a condition that causes discharge as well as soreness, swelling, and redness (inflammation) of the vagina.  This condition is treated with medicine. Medicines may be over-the-counter or prescription.  Take or apply over-the-counter and prescription medicines only as told by your health care provider.  Do not douche.   Do not have sex or use tampons until your health care provider approves.  Contact a health care provider if your symptoms do not get better with treatment or your symptoms go away and then return. This information is not intended to replace advice given to you by your health care provider. Make sure you discuss any questions you have with your health care  provider. Document Released: 02/27/2005 Document Revised: 10/06/2017 Document Reviewed: 10/06/2017 Elsevier Patient Education  2020 [employer]  

## 2019-01-01 NOTE — MAU Note (Signed)
PT SAYS SHE GETS PNC WITH [facility]-  SAW  DR ROBERTS LAST -   THE BABY PUSHES DOWN- WENT TO BATHROOM- ON Tuesday   SAW LARGE AMT MUCUS-  CALLED DR -  NO ANSWER.  .  THEN  CALLED OFFICE  TODAY - TOLD TO COME HERE .

## 2019-01-05 LAB — GC/CHLAMYDIA PROBE AMP (~~LOC~~) NOT AT ARMC
Chlamydia: NEGATIVE
Neisseria Gonorrhea: NEGATIVE

## 2019-01-13 ENCOUNTER — Encounter (HOSPITAL_COMMUNITY): Payer: Self-pay

## 2019-01-15 ENCOUNTER — Other Ambulatory Visit (HOSPITAL_COMMUNITY): Payer: Self-pay | Admitting: Obstetrics and Gynecology

## 2019-01-15 DIAGNOSIS — Z3689 Encounter for other specified antenatal screening: Secondary | ICD-10-CM

## 2019-01-15 DIAGNOSIS — Z3A24 24 weeks gestation of pregnancy: Secondary | ICD-10-CM

## 2019-01-22 ENCOUNTER — Other Ambulatory Visit (HOSPITAL_COMMUNITY): Payer: Self-pay | Admitting: *Deleted

## 2019-01-22 ENCOUNTER — Other Ambulatory Visit: Payer: Self-pay

## 2019-01-22 ENCOUNTER — Ambulatory Visit (HOSPITAL_COMMUNITY): Payer: Medicaid Other | Admitting: *Deleted

## 2019-01-22 ENCOUNTER — Encounter (HOSPITAL_COMMUNITY): Payer: Self-pay

## 2019-01-22 ENCOUNTER — Ambulatory Visit (HOSPITAL_COMMUNITY)
Admission: RE | Admit: 2019-01-22 | Discharge: 2019-01-22 | Disposition: A | Discharge status: Home / Self Care | Payer: Medicaid Other | Source: Ambulatory Visit | Attending: Obstetrics and Gynecology | Admitting: Obstetrics and Gynecology

## 2019-01-22 VITALS — BP 124/74 | HR 86 | Temp 99.6°F

## 2019-01-22 DIAGNOSIS — Z8279 Family history of other congenital malformations, deformations and chromosomal abnormalities: Secondary | ICD-10-CM

## 2019-01-22 DIAGNOSIS — O09292 Supervision of pregnancy with other poor reproductive or obstetric history, second trimester: Secondary | ICD-10-CM | POA: Diagnosis not present

## 2019-01-22 DIAGNOSIS — O99212 Obesity complicating pregnancy, second trimester: Secondary | ICD-10-CM

## 2019-01-22 DIAGNOSIS — O09899 Supervision of other high risk pregnancies, unspecified trimester: Secondary | ICD-10-CM

## 2019-01-22 DIAGNOSIS — Z362 Encounter for other antenatal screening follow-up: Secondary | ICD-10-CM

## 2019-01-22 DIAGNOSIS — Z3A24 24 weeks gestation of pregnancy: Secondary | ICD-10-CM | POA: Diagnosis not present

## 2019-01-22 DIAGNOSIS — Z3689 Encounter for other specified antenatal screening: Secondary | ICD-10-CM | POA: Insufficient documentation

## 2019-02-22 ENCOUNTER — Ambulatory Visit (HOSPITAL_COMMUNITY): Payer: Self-pay | Admitting: Genetic Counselor

## 2019-02-22 ENCOUNTER — Other Ambulatory Visit: Payer: Self-pay

## 2019-02-22 ENCOUNTER — Ambulatory Visit (HOSPITAL_BASED_OUTPATIENT_CLINIC_OR_DEPARTMENT_OTHER): Payer: Medicaid Other | Admitting: Genetic Counselor

## 2019-02-22 ENCOUNTER — Other Ambulatory Visit (HOSPITAL_COMMUNITY): Payer: Self-pay | Admitting: *Deleted

## 2019-02-22 ENCOUNTER — Ambulatory Visit (HOSPITAL_COMMUNITY)
Admission: RE | Admit: 2019-02-22 | Discharge: 2019-02-22 | Disposition: A | Discharge status: Home / Self Care | Payer: Medicaid Other | Source: Ambulatory Visit | Attending: Obstetrics and Gynecology | Admitting: Obstetrics and Gynecology

## 2019-02-22 ENCOUNTER — Other Ambulatory Visit (HOSPITAL_COMMUNITY): Payer: Self-pay | Admitting: Obstetrics and Gynecology

## 2019-02-22 ENCOUNTER — Ambulatory Visit (HOSPITAL_COMMUNITY): Payer: Medicaid Other | Admitting: *Deleted

## 2019-02-22 ENCOUNTER — Encounter (HOSPITAL_COMMUNITY): Payer: Self-pay | Admitting: *Deleted

## 2019-02-22 VITALS — BP 116/72 | HR 100 | Temp 98.5°F

## 2019-02-22 DIAGNOSIS — O09293 Supervision of pregnancy with other poor reproductive or obstetric history, third trimester: Secondary | ICD-10-CM | POA: Diagnosis not present

## 2019-02-22 DIAGNOSIS — O352XX1 Maternal care for (suspected) hereditary disease in fetus, fetus 1: Secondary | ICD-10-CM

## 2019-02-22 DIAGNOSIS — Z8279 Family history of other congenital malformations, deformations and chromosomal abnormalities: Secondary | ICD-10-CM

## 2019-02-22 DIAGNOSIS — O99213 Obesity complicating pregnancy, third trimester: Secondary | ICD-10-CM

## 2019-02-22 DIAGNOSIS — Z362 Encounter for other antenatal screening follow-up: Secondary | ICD-10-CM

## 2019-02-22 DIAGNOSIS — Z315 Encounter for genetic counseling: Secondary | ICD-10-CM

## 2019-02-22 DIAGNOSIS — Q270 Congenital absence and hypoplasia of umbilical artery: Secondary | ICD-10-CM | POA: Diagnosis present

## 2019-02-22 DIAGNOSIS — Z3A28 28 weeks gestation of pregnancy: Secondary | ICD-10-CM | POA: Diagnosis not present

## 2019-02-22 DIAGNOSIS — Z31430 Encounter of female for testing for genetic disease carrier status for procreative management: Secondary | ICD-10-CM

## 2019-02-22 NOTE — Progress Notes (Signed)
02/22/2019  Zayna J Berke 10/01/1995 MRN: [id number] DOV: 02/22/2019  Ms. Robak presented to the [facility] for a genetics consultation regarding her history of a previous child with [place] syndrome. Ms. Delacruz came to her appointment alone due to COVID-19 visitor restrictions.   Indication for genetic counseling - Previous child with [place] syndrome  Prenatal history  Ms. Frappier is a G2P1001, 23 y.o. female. Her current pregnancy has completed [redacted]w[redacted]d (Estimated Date of Delivery: 05/12/19).  Ms. Casali denied exposure to environmental toxins or chemical agents. She denied the use of alcohol, tobacco or street drugs. She denied significant viral illnesses, fevers, and bleeding during the course of her pregnancy. Her medical and surgical histories were noncontributory.  Family History  A three generation pedigree was drafted and reviewed. The family history is remarkable for the following:  - Ms. Fishburn reported a personal history of learning difficulties that required extra assistance in school. We discussed that many times, learning difficultiesare multifactorial in nature, occurring due to a combination of genetic and environmental factorsthat are difficult to identify.Learning disabilitiescan appear to run in families; thus, there is a chance that Ms. Fornes's children could also experience learning difficultiesof some kind.Sheunderstands that sheshould make the pediatrician aware of any concerns she has about her children'sdevelopment.  - Ms. Lichtenberger has a maternal half brother who had nasal cancer at 20 years of age. Though most cancers are thought to be sporadic or due to environmental factors, some individuals inherit genetic mutations that increase their risk for developing certain cancers from a parent. Inherited changes in cancer-related genes are not believed to cause very many cancers of the nasal cavity or paranasal sinuses. Thus, risk of recurrence for  Ms. Isaza's children is low.  - Ms. Touch has a maternal half sister whose daughter has alopecia and was born with a "hole in her heart". It is possible that this individual's features could be a part of a genetic condition; however, the precise etiology is unknown. For this reason, risk assessment is limited.  The remaining family histories were reviewed and found to be noncontributory for birth defects, intellectual disability, recurrent pregnancy loss, and known genetic conditions. Ms. Fabbro had limited information about her partner's family history; thus, risk assessment was limited.  The patient's ethnicity is African American. The father of the pregnancy's ethnicity is African American. Ashkenazi Jewish ancestry and consanguinity were denied. Pedigree will be scanned under Media.  Discussion  Ms. Altidor was referred to genetic counseling due to her history of having a previous child with [place] syndrome. [place] syndrome is a disorder in which affected individuals are born with missing or underdeveloped muscles on one side of the body, resulting in abnormalities that can affect the chest, shoulder, arm, and hand. Individuals with [place] syndrome are typically missing part of the pectoralis major chest muscle. Generally, a large section of the muscle that runs from the upper arm to the sternum is missing in affected individuals, causing a concave appearance of the chest. This generally does not cause any health problems or affect movement. Other possible features of [place] syndrome include shortened ribs, ribs that are more noticeable due to less subcutaneous fat, missing or undeveloped breasts or areolas, sparse or abnormally placed axillary hair, underdeveloped hands with brachydactyly (abnormally short fingers), small vestigial fingers, syndactyly (webbed fingers), and shortened radii/ulnas. Rarely, severely affected individuals may have pulmonary or renal anomalies or dextrocardia (a heart  abnormally located in the right side of the chest). The  extent and severity of the abnormalities associated with [place] syndrome vary among affected individuals. Ms. Christianson indicated that her son has the chest abnormalities associated with [place] syndrome along with a small hand with webbed fingers.   [place] syndrome occurs in 1 in 20,000 newborns. For unknown reasons, the disorder occurs more than twice as often in males than in females. The exact cause of [place] syndrome is unknown, though researchers have suggested that it may result from a disruption of blood vessels that will later become the subclavian and vertebral arteries during the sixth week of embryonic development. Most cases of [place] syndrome are sporadic and occur in individuals with no family history of the disorder. However, rare cases of familial recurrence have been reported. In these cases, [place] syndrome appears to be inherited in an autosomal dominant pattern. Ms. Severns was counseled that while recurrence is unlikely, recurrence risk could be as high as 50%.   We discussed that there are not any screening or testing options during pregnancy aside from ultrasound, as no genes associated with [place] syndrome have been identified at this time. While most diagnoses of [place] syndrome are made at birth, severely affected individuals may be identified prenatally via ultrasound. A complete ultrasound was performed on 8/21. The ultrasound report will be sent under separate cover. There were no visualized fetal anomalies or markers suggestive of aneuploidy or [place] syndrome. However, a single umbilical artery was detected on that ultrasound. A follow-up ultrasound performed prior to our appointment today confirmed a two-vessel umbilical cord and determined that the estimated fetal weight is at the 9th percentile.  Ms. Rabold was also counseled about additional screening and testing options that are available during pregnancy for conditions other  than [place] syndrome. Per ACOG recommendation, carrier screening for hemoglobinopathies, cystic fibrosis (CF) and spinal muscular atrophy (SMA) was discussed including information about the conditions, rationale for testing, autosomal recessive inheritance, and the option of prenatal diagnosis. Ms. Bari previously had a normal MCV of 86 and negative hemoglobin electrophoresis, reducing her risk to be a carrier of a hemoglobinopathy. She has not yet had carrier screening for CF or SMA, but indicated that she was interested in pursuing carrier screening for those conditions.   We also reviewed that per records, Ms. Fonte had noninvasive prenatal screening (NIPS) that was low-risk for fetal aneuploidies. We reviewed that theseresults did not show an increased risk for trisomies 66, 32 and 53, and sex chromosome aneuploidies in the current pregnancy. We reviewed that while this testing identifies most pregnancies with trisomy 56, trisomy 79, and sex chromosome aneuploidies, it is NOT diagnostic. A positive test result requires confirmation by CVS or amniocentesis, and a negative test result does not rule out a fetal chromosome abnormality. She also understands that this testing does not identify all genetic conditions.  Ms. Hemberger was also counseled regarding diagnostic testing via amniocentesis. We discussed the technical aspects of the procedure and quoted up to a 1 in 500 (0.2%) risk for spontaneous pregnancy loss or other adverse pregnancy outcomes as a result of amniocentesis. Cultured cells from an amniocentesis sample allow for the visualization of a fetal karyotype, which can detect >99% of chromosomal aberrations. Chromosomal microarray can also be performed to identify smaller deletions or duplications of fetal chromosomal material. We reviewed that since no genes have been associated with [place] syndrome, it would not be possible to determine if the current fetus has [place] syndrome via amniocentesis.  After careful consideration, Ms. Veilleux declined amniocentesis at this time.  She understands that amniocentesis is available at any point after 16 weeks of pregnancy and that she may opt to undergo the procedure at a later date should she change her mind.  Ms. Cleckley indicated that she was interested in pursuing carrier screening for CF and SMA. However, she requested to undergo carrier screening via saliva rather than having her blood drawn. Ms. Olsen will be returning for a follow-up ultrasound on 10/5 and requested that I assist with saliva sample collection at that time. Once the sample is collected, results will take 2-3 weeks to be returned. I will call Ms. Thayer with her results when they become available.  Ms. Dragos indicated that her son has been seen by Dr. Nicoletta Dress, an orthopedic surgeon at [facility], who recommended hand surgery for her son's syndactyly. However, Ms. Fyfe had several questions and concerns regarding the timing of surgery that she has not been able to ask due to transportation issues. I encouraged her to call Dr. August Saucer office to see if it would be possible to set up a telehealth visit so that she could get her questions answered.   I counseled Ms. Hedlund regarding the above risks and available options. The approximate face-to-face time with the genetic counselor was 25 minutes.  In summary:  Discussed family history of [place] syndrome and options for follow-up testing  Recurrence is unlikely, but could be as high as 50%  No genetic testing available for [place] syndrome  Some severe cases may be identified on ultrasound, most diagnosed at birth  Recommended carrier screening for cystic fibrosis and spinal muscular atrophy  Opted to undergo carrier screening at her next appointment (10/5). We will follow results  Reviewed low-risk NIPS result  Reduction in risk for chromosomal aneuploidies  Reviewed results of ultrasound  No fetal anomalies or markers seen  Reduction  in risk for fetal aneuploidy  Offered additional testing and screening  Declined amniocentesis   Buelah Manis, MS Genetic Counselor

## 2019-03-08 ENCOUNTER — Other Ambulatory Visit (HOSPITAL_COMMUNITY): Payer: Self-pay | Admitting: *Deleted

## 2019-03-08 ENCOUNTER — Ambulatory Visit (HOSPITAL_COMMUNITY): Payer: Medicaid Other | Admitting: *Deleted

## 2019-03-08 ENCOUNTER — Other Ambulatory Visit (HOSPITAL_COMMUNITY): Payer: Self-pay | Admitting: Obstetrics

## 2019-03-08 ENCOUNTER — Ambulatory Visit (HOSPITAL_COMMUNITY)
Admission: RE | Admit: 2019-03-08 | Discharge: 2019-03-08 | Disposition: A | Discharge status: Home / Self Care | Payer: Medicaid Other | Source: Ambulatory Visit | Attending: Obstetrics | Admitting: Obstetrics

## 2019-03-08 ENCOUNTER — Encounter (HOSPITAL_COMMUNITY): Payer: Self-pay

## 2019-03-08 ENCOUNTER — Other Ambulatory Visit: Payer: Self-pay

## 2019-03-08 VITALS — BP 129/82 | HR 92 | Temp 98.6°F

## 2019-03-08 DIAGNOSIS — O09293 Supervision of pregnancy with other poor reproductive or obstetric history, third trimester: Secondary | ICD-10-CM | POA: Diagnosis not present

## 2019-03-08 DIAGNOSIS — O365930 Maternal care for other known or suspected poor fetal growth, third trimester, not applicable or unspecified: Secondary | ICD-10-CM | POA: Diagnosis not present

## 2019-03-08 DIAGNOSIS — Z362 Encounter for other antenatal screening follow-up: Secondary | ICD-10-CM | POA: Diagnosis not present

## 2019-03-08 DIAGNOSIS — O99213 Obesity complicating pregnancy, third trimester: Secondary | ICD-10-CM | POA: Diagnosis not present

## 2019-03-08 DIAGNOSIS — O365990 Maternal care for other known or suspected poor fetal growth, unspecified trimester, not applicable or unspecified: Secondary | ICD-10-CM | POA: Diagnosis present

## 2019-03-08 DIAGNOSIS — O09899 Supervision of other high risk pregnancies, unspecified trimester: Secondary | ICD-10-CM

## 2019-03-08 DIAGNOSIS — Z3A30 30 weeks gestation of pregnancy: Secondary | ICD-10-CM

## 2019-03-10 ENCOUNTER — Encounter (HOSPITAL_COMMUNITY): Payer: Self-pay

## 2019-03-10 ENCOUNTER — Other Ambulatory Visit: Payer: Self-pay

## 2019-03-10 ENCOUNTER — Inpatient Hospital Stay (HOSPITAL_COMMUNITY)
Admission: AD | Admit: 2019-03-10 | Discharge: 2019-03-11 | Disposition: A | Discharge status: Home / Self Care | Payer: Medicaid Other | Attending: Obstetrics & Gynecology | Admitting: Obstetrics & Gynecology

## 2019-03-10 DIAGNOSIS — J45909 Unspecified asthma, uncomplicated: Secondary | ICD-10-CM | POA: Insufficient documentation

## 2019-03-10 DIAGNOSIS — Z3A31 31 weeks gestation of pregnancy: Secondary | ICD-10-CM | POA: Insufficient documentation

## 2019-03-10 DIAGNOSIS — Z87891 Personal history of nicotine dependence: Secondary | ICD-10-CM | POA: Diagnosis not present

## 2019-03-10 DIAGNOSIS — M545 Low back pain, unspecified: Secondary | ICD-10-CM

## 2019-03-10 DIAGNOSIS — R102 Pelvic and perineal pain: Secondary | ICD-10-CM | POA: Insufficient documentation

## 2019-03-10 DIAGNOSIS — O26893 Other specified pregnancy related conditions, third trimester: Secondary | ICD-10-CM | POA: Diagnosis present

## 2019-03-10 DIAGNOSIS — O99513 Diseases of the respiratory system complicating pregnancy, third trimester: Secondary | ICD-10-CM | POA: Insufficient documentation

## 2019-03-10 LAB — URINALYSIS, ROUTINE W REFLEX MICROSCOPIC
Bilirubin Urine: NEGATIVE
Glucose, UA: NEGATIVE mg/dL
Hgb urine dipstick: NEGATIVE
Ketones, ur: NEGATIVE mg/dL
Nitrite: NEGATIVE
Protein, ur: 30 mg/dL — AB
Specific Gravity, Urine: 1.032 — ABNORMAL HIGH (ref 1.005–1.030)
pH: 6 (ref 5.0–8.0)

## 2019-03-10 LAB — FETAL FIBRONECTIN: Fetal Fibronectin: NEGATIVE

## 2019-03-10 NOTE — MAU Note (Signed)
Pt reports lower pelvic pain 9/10 that began on 03/08/19. +FM. No vaginal bleeding or discharge. She called doctor and was advised to come into MAU. Two vessel cord and IUGR per patient.

## 2019-03-10 NOTE — MAU Provider Note (Signed)
Chief Complaint:  Pelvic Pain   First Provider Initiated Contact with Patient 03/10/19 2217      HPI: Sylvia Chambers is a 23 y.o. G2P1001 at 19w0dwho presents to maternity admissions reporting pelvic pain since 2 days ago.  Got worse tonight.  . She reports good fetal movement, denies LOF, vaginal bleeding, vaginal itching/burning, urinary symptoms, h/a, dizziness, n/v, diarrhea, constipation or fever/chills.    RN note: Pt reports lower pelvic pain 9/10 that began on 03/08/19. +FM. No vaginal bleeding or discharge. She called doctor and was advised to come into MAU  Past Medical History: Past Medical History:  Diagnosis Date  . Asthma   . Infection    UTI  . Pregnancy induced hypertension    with first preg    Past obstetric history: OB History  Gravida Para Term Preterm AB Living  2 1 1     1   SAB TAB Ectopic Multiple Live Births        0 1    # Outcome Date GA Lbr Len/2nd Weight Sex Delivery Anes PTL Lv  2 Current           1 Term 10/17/17 [redacted]w[redacted]d 06:13 / 00:30 3045 g M Vag-Spont EPI  LIV    Past Surgical History: Past Surgical History:  Procedure Laterality Date  . EYE SURGERY      Family History: Family History  Problem Relation Age of Onset  . Hypertension Mother   . Asthma Mother   . Healthy Father     Social History: Social History   Tobacco Use  . Smoking status: Former Smoker    Years: 2.00    Types: Cigars    Quit date: 11/22/2018    Years since quitting: 0.2  . Smokeless tobacco: Never Used  . Tobacco comment: black and mild 2/day  Substance Use Topics  . Alcohol use: No  . Drug use: Never    Allergies: No Known Allergies  Meds:  Medications Prior to Admission  Medication Sig Dispense Refill Last Dose  . Prenatal Vit-Fe Fumarate-FA (PRENATAL COMPLETE) 14-0.4 MG TABS Take 1 tablet by mouth daily. (Patient not taking: Reported on 01/22/2019) 30 each 0   . terconazole (TERAZOL 7) 0.4 % vaginal cream Place 1 applicator vaginally at bedtime.  (Patient not taking: Reported on 01/22/2019) 45 g 0     I have reviewed patient's Past Medical Hx, Surgical Hx, Family Hx, Social Hx, medications and allergies.   ROS:  Review of Systems  Constitutional: Negative for chills and fever.  Respiratory: Negative for shortness of breath.   Gastrointestinal: Negative for constipation, diarrhea and nausea.  Genitourinary: Positive for pelvic pain. Negative for dysuria, frequency and vaginal bleeding.  Musculoskeletal: Negative for back pain.   Other systems negative  Physical Exam   Patient Vitals for the past 24 hrs:  BP Temp Temp src Pulse Resp SpO2 Weight  03/10/19 2120 132/79 98.2 F (36.8 C) Oral 89 18 100 % 90.6 kg   Constitutional: Well-developed, well-nourished female in no acute distress.  Cardiovascular: normal rate and rhythm Respiratory: normal effort, clear to auscultation bilaterally GI: Abd soft, non-tender, gravid appropriate for gestational age.   No rebound or guarding. MS: Extremities nontender, no edema, normal ROM Neurologic: Alert and oriented x 4.  GU: Neg CVAT.  PELVIC EXAM: Dilation: Closed Effacement (%): 30 Station: -2 Presentation: Vertex Exam by:: Elray Mcgregor, RN    FHT:  Baseline 140 , moderate variability, accelerations present, no decelerations Contractions: intermittent irritability  Labs: Results for orders placed or performed during the hospital encounter of 03/10/19 (from the past 24 hour(s))  Urinalysis, Routine w reflex microscopic     Status: Abnormal   Collection Time: 03/10/19  9:18 PM  Result Value Ref Range   Color, Urine YELLOW YELLOW   APPearance CLOUDY (A) CLEAR   Specific Gravity, Urine 1.032 (H) 1.005 - 1.030   pH 6.0 5.0 - 8.0   Glucose, UA NEGATIVE NEGATIVE mg/dL   Hgb urine dipstick NEGATIVE NEGATIVE   Bilirubin Urine NEGATIVE NEGATIVE   Ketones, ur NEGATIVE NEGATIVE mg/dL   Protein, ur 30 (A) NEGATIVE mg/dL   Nitrite NEGATIVE NEGATIVE   Leukocytes,Ua SMALL (A)  NEGATIVE   RBC / HPF 0-5 0 - 5 RBC/hpf   WBC, UA 0-5 0 - 5 WBC/hpf   Bacteria, UA MANY (A) NONE SEEN   Squamous Epithelial / LPF 21-50 0 - 5   Mucus PRESENT   Fetal fibronectin     Status: None   Collection Time: 03/10/19 10:36 PM  Result Value Ref Range   Fetal Fibronectin NEGATIVE NEGATIVE       Imaging:   MAU Course/MDM: I have ordered labs and reviewed results. [place] suspicious.  Sent to culture NST reviewed, reactive FFn sent due to irritability, negative Treatments in MAU included Flexeril for back pain which gave her complete relief.    Assessment: SIUP at [redacted]w[redacted]d Uterine irritability Low back pain, resolved Suspected UTI  Plan: Discharge home Preterm Labor precautions and fetal kick counts Rx Keflex for presumed UTI Urine to culture Follow up in Office for prenatal visits and recheck  Encouraged to return here or to other Urgent Care/ED if she develops worsening of symptoms, increase in pain, fever, or other concerning symptoms.    Pt stable at time of discharge.  Wynelle Bourgeois CNM, MSN Certified Nurse-Midwife 03/10/2019 10:17 PM

## 2019-03-11 ENCOUNTER — Other Ambulatory Visit: Payer: Self-pay | Admitting: Advanced Practice Midwife

## 2019-03-11 ENCOUNTER — Telehealth (HOSPITAL_COMMUNITY): Payer: Self-pay | Admitting: Genetic Counselor

## 2019-03-11 LAB — CULTURE, OB URINE: Culture: <10000 — AB

## 2019-03-11 MED ORDER — CEPHALEXIN 500 MG PO CAPS: 500.0000 mg | ORAL_CAPSULE | Freq: Four times a day (QID) | ORAL | 2 refills | Status: DC

## 2019-03-11 MED ORDER — CYCLOBENZAPRINE HCL 10 MG PO TABS
5.0000 mg | ORAL_TABLET | Freq: Once | ORAL | Status: AC
  Administered 2019-03-11: 5 mg via ORAL
  Filled 2019-03-11: qty 1

## 2019-03-11 NOTE — MAU Note (Addendum)
Pt discharged during downtime, due to this pt did not sign electronic AVS.

## 2019-03-11 NOTE — Telephone Encounter (Signed)
Called Sylvia Chambers as we did not connect on 10/5 as planned for me to facilitate sample collection for carrier screening. Sylvia Chambers indicated that she is still interested in undergoing carrier screening. She would prefer to have it done via saliva in person rather than having a saliva kit mailed to her. She has an ultrasound in [facility] on 10/27 and indicated that she was okay with having carrier screening performed at that time, with the caveat that we would get results back very close to her due date. We will plan on performing carrier screening at that time.  Sylvia Manis, MS Genetic Counselor

## 2019-03-11 NOTE — Discharge Instructions (Signed)

## 2019-03-11 NOTE — Progress Notes (Signed)
Seen in MAU for cramping UA suggestive of UTI WIll Rx Keflex for presumed UTI

## 2019-03-30 ENCOUNTER — Encounter (HOSPITAL_COMMUNITY): Payer: Self-pay

## 2019-03-30 ENCOUNTER — Ambulatory Visit (HOSPITAL_COMMUNITY): Payer: Medicaid Other

## 2019-03-30 ENCOUNTER — Ambulatory Visit (HOSPITAL_COMMUNITY): Payer: Medicaid Other | Attending: Obstetrics and Gynecology

## 2019-04-12 ENCOUNTER — Other Ambulatory Visit: Payer: Self-pay | Admitting: Obstetrics and Gynecology

## 2019-04-14 ENCOUNTER — Telehealth (HOSPITAL_COMMUNITY): Payer: Self-pay | Admitting: *Deleted

## 2019-04-14 NOTE — Telephone Encounter (Signed)
Preadmission screen  

## 2019-04-19 ENCOUNTER — Telehealth (HOSPITAL_COMMUNITY): Payer: Self-pay | Admitting: *Deleted

## 2019-04-19 ENCOUNTER — Other Ambulatory Visit (HOSPITAL_COMMUNITY): Admission: RE | Admit: 2019-04-19 | Payer: Medicaid Other | Source: Ambulatory Visit

## 2019-04-19 NOTE — Telephone Encounter (Signed)
Preadmission screen  

## 2019-04-20 ENCOUNTER — Other Ambulatory Visit (HOSPITAL_COMMUNITY)
Admission: RE | Admit: 2019-04-20 | Discharge: 2019-04-20 | Disposition: A | Discharge status: Home / Self Care | Payer: Medicaid Other | Source: Ambulatory Visit | Attending: Obstetrics and Gynecology | Admitting: Obstetrics and Gynecology

## 2019-04-20 LAB — SARS CORONAVIRUS 2 (TAT 6-24 HRS): SARS Coronavirus 2: NEGATIVE

## 2019-04-20 NOTE — H&P (Signed)
Sylvia Chambers is a 23 y.o. female, G2P1001, IUP at 37 weeks, presenting for IOL for IUGR & 2VC, EFW 27%, 5lbs on 10/30, lagginf FL<2%, BPP 8/8, doppler WNL, GBS-, low risk female. 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 GROWTH Q FOUR WEEKS.  PANORAMA WNL.  PT SEEN BY MFM.  [place] CONFIRMED. NO ANAMOLIES SEEN. PT ASKED FOR GENETIC COUNSELING.  AND WAS SCHEDULED.); family history of Congenital anomaly (Son with [place] Sequence, with syndactly and chest wall abnormalitis. Had genetic counseling at MFM.) fetal growth restriction (EFW 9%ile at 28 weeks, scheduled for f/u growth and dopplers at [facility] 03/07/20. 19%ILE AT 30 WEEKS PER MFM (10/5) 27%ILE AT [facility] AT 34 WEEKS (10/30)) history of pre-eclampsia (2019 pregnancy, induced, recommend ASA) varicella non-immune (Offer vaccine pp). Pt endorse + Fm. Denies vaginal leakage. Denies vaginal bleeding. Denies feeling cxt's.   Patient Active Problem List   Diagnosis Date Noted  . IUGR (intrauterine growth restriction) affecting care of mother, third trimester, not applicable or unspecified fetus 04/21/2019  . Preeclampsia 10/16/2017    Medications Prior to Admission  Medication Sig Dispense Refill Last Dose  . cephALEXin (KEFLEX) 500 MG capsule Take 1 capsule (500 mg total) by mouth 4 (four) times daily. 28 capsule 2   . Prenatal Vit-Fe Fumarate-FA (PRENATAL COMPLETE) 14-0.4 MG TABS Take 1 tablet by mouth daily. (Patient not taking: Reported on 01/22/2019) 30 each 0   . terconazole (TERAZOL 7) 0.4 % vaginal cream Place 1 applicator vaginally at bedtime. (Patient not taking: Reported on 01/22/2019) 45 g 0     Past Medical History:  Diagnosis Date  . Asthma   . Infection    UTI  . Pregnancy induced hypertension      No current facility-administered medications on file prior to encounter.    Current Outpatient  Medications on File Prior to Encounter  Medication Sig Dispense Refill  . cephALEXin (KEFLEX) 500 MG capsule Take 1 capsule (500 mg total) by mouth 4 (four) times daily. 28 capsule 2  . Prenatal Vit-Fe Fumarate-FA (PRENATAL COMPLETE) 14-0.4 MG TABS Take 1 tablet by mouth daily. (Patient not taking: Reported on 01/22/2019) 30 each 0  . terconazole (TERAZOL 7) 0.4 % vaginal cream Place 1 applicator vaginally at bedtime. (Patient not taking: Reported on 01/22/2019) 45 g 0     No Known Allergies  History of present pregnancy: Pt Info/Preference:  Screening/Consents:  Labs:   EDD: Estimated Date of Delivery: 05/12/19  Establised: Patient's last menstrual period was 08/05/2018.  Anatomy Scan: Date: 01/13/2019 Placenta Location: right lateral  Genetic Screen: [place]:low risk female AFP:  First Tri: Quad:  Office: ccob            First PNV: 14.2 wg Blood Type --/--/B POS, B POS Performed at [facility], [address] [place], [place] [address]  [phone])  Language: english Last PNV: 36 wg Rhogam    Flu Vaccine:  declined   Antibody NEG (11/18 0058)  TDaP vaccine utd   GTT: Early: 4.7 Third Trimester: 84  Feeding Plan: breast BTL: no Rubella: Immune (06/02 0000)  Contraception: ??? VBAC: no RPR: Nonreactive (06/02 0000)   Circumcision: ???   HBsAg: Negative (06/02 0000)  Pediatrician:  ???   HIV: Non-reactive (06/02 0000)   Prenatal Classes: no Additional [place]: See below growth 10/30 GBS:  (For PCN allergy, check sensitivities)  Chlamydia: neg    MFM Referral/Consult:  GC: neg  Support Person: partner   PAP: 2020 HPV+  Pain Management: epidural Neonatologist Referral:  Hgb Electrophoresis:  AA  Birth Plan: no   Hgb NOB: 12.4    28W: 11.4  04/02/2019 Growth:  OB History    Gravida  2   Para  1   Term  1   Preterm      AB      Living  1     SAB      TAB      Ectopic      Multiple  0   Live Births  1          Past Medical History:  Diagnosis Date  .  Asthma   . Infection    UTI  . Pregnancy induced hypertension    Past Surgical History:  Procedure Laterality Date  . EYE SURGERY     Family History: family history includes Asthma in her mother; Healthy in her father; Hypertension in her mother. Social History:  reports that she quit smoking about 4 months ago. Her smoking use included cigars. She quit after 2.00 years of use. She has never used smokeless tobacco. She reports that she does not drink alcohol or use drugs.   Prenatal Transfer Tool  Maternal Diabetes: No Genetic Screening: Normal Maternal Ultrasounds/Referrals: Normal Fetal Ultrasounds or other Referrals:  None Maternal Substance Abuse:  No Significant Maternal Medications:  None Significant Maternal Lab Results: Group B Strep negative  ROS:  Review of Systems  Constitutional: Negative.   HENT: Negative.   Eyes: Negative.   Respiratory: Negative.   Cardiovascular: Negative.   Gastrointestinal: Negative.   Genitourinary: Negative.   Musculoskeletal: Negative.   Skin: Negative.   Neurological: Negative.   Endo/Heme/Allergies: Negative.   Psychiatric/Behavioral: Negative.      Physical Exam: BP 135/85   Pulse (!) 107   Temp 98 F (36.7 C)   Resp 16   Ht 5\' 3"  (1.6 m)   Wt 90.3 kg   LMP 08/05/2018   BMI 35.25 kg/m   Physical Exam  Constitutional: She is oriented to person, place, and time and well-developed, well-nourished, and in no distress.  HENT:  Head: Normocephalic and atraumatic.  Eyes: Pupils are equal, round, and reactive to light. Conjunctivae are normal.  Neck: Normal range of motion. Neck supple.  Cardiovascular: Normal rate and regular rhythm.  Pulmonary/Chest: Effort normal and breath sounds normal.  Abdominal: Soft. Bowel sounds are normal.  Genitourinary:    Genitourinary Comments: Uterus gravida, pelvis adequate, soft non-tender.    Musculoskeletal: Normal range of motion.  Neurological: She is alert and oriented to person,  place, and time. She has normal reflexes. Gait normal.  Skin: Skin is warm and dry.  Psychiatric: Affect normal.  Nursing note and vitals reviewed.    NST: FHR baseline 145 bpm, Variability: moderate, Accelerations:present, Decelerations:  Absent= Cat 1/Reactive UC:   none SVE: 0/50/-3  Dilation: Closed Effacement (%): 20 Station: -2 Exam by:: [employer], vertex verified by fetal sutures.  Leopold's: Position vertex, EFW 5.5lbs via leopold's.   Labs: Results for orders placed or performed during the hospital encounter of 04/21/19 (from the past 24 hour(s))  CBC     Status: Abnormal   Collection Time: 04/21/19 12:58 AM  Result Value Ref Range   WBC 6.8 4.0 - 10.5 K/uL   RBC 3.57 (L) 3.87 - 5.11 MIL/uL   Hemoglobin 10.2 (L) 12.0 -  15.0 g/dL   HCT 16.131.2 (L) 09.636.0 - 04.546.0 %   MCV 87.4 80.0 - 100.0 fL   MCH 28.6 26.0 - 34.0 pg   MCHC 32.7 30.0 - 36.0 g/dL   RDW 40.911.9 81.111.5 - 91.415.5 %   Platelets 268 150 - 400 K/uL   nRBC 0.0 0.0 - 0.2 %  Type and screen     Status: None   Collection Time: 04/21/19 12:58 AM  Result Value Ref Range   ABO/RH(D) B POS    Antibody Screen NEG    Sample Expiration      04/24/2019,2359 Performed at [facility], [address] [place], [place] [address]   ABO/Rh     Status: None (Preliminary result)   Collection Time: 04/21/19 12:58 AM  Result Value Ref Range   ABO/RH(D)      B POS Performed at [facility], [address] [place], [place] [address]     Imaging:  No results found.  MAU Course: Orders Placed This Encounter  Procedures  . CBC  . RPR  . OB RESULTS CONSOLE GC/Chlamydia  . Type and screen  . ABO/Rh  . Admit to Inpatient (patient's expected length of stay will be greater than 2 midnights or inpatient only procedure)   Meds ordered this encounter  Medications  . DISCONTD: misoprostol (CYTOTEC) tablet 25 mcg  . DISCONTD: oxytocin (PITOCIN) IV infusion 40 units in NS 1000 mL - Premix    Order Specific Question:    Begin infusion at:    Answer:   1 milli-unit/min (1.5 mL/hr)    Order Specific Question:   Increase infusion by:    Answer:   1 milli-unit/min (1.5 mL/hr)  . DISCONTD: terbutaline (BRETHINE) injection 0.25 mg  . DISCONTD: lactated ringers infusion  . DISCONTD: lactated ringers infusion 500-1,000 mL  . DISCONTD: oxytocin (PITOCIN) IV BOLUS FROM BAG  . DISCONTD: oxytocin (PITOCIN) IV infusion 40 units in NS 1000 mL - Premix  . DISCONTD: acetaminophen (TYLENOL) tablet 650 mg  . DISCONTD: fentaNYL (SUBLIMAZE) injection 50-100 mcg  . DISCONTD: lidocaine (PF) (XYLOCAINE) 1 % injection 30 mL  . DISCONTD: ondansetron (ZOFRAN) injection 4 mg  . DISCONTD: sodium citrate-citric acid (ORACIT) solution 30 mL  . DISCONTD: sodium phosphate (FLEET) 7-19 GM/118ML enema 1 enema    Assessment/Plan: Kandis NabDonyasha J Chambers is a 23 y.o. female, G2P1001, IUP at 37 weeks, presenting for IOL for IUGR & 2VC, EFW 27%, 5lbs on 10/30, lagginf FL<2%, BPP 8/8, doppler WNL, GBS-, low risk female. 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 GROWTH Q FOUR WEEKS.  PANORAMA WNL.  PT SEEN BY MFM.  US CONFIRMED. NO ANAMOLIES SEEN. PT ASKED FOR GENETIC COUNSELING.  AND WAS SCHEDULED.); family history of Congenital anomaly (Son with [place] Sequence, with syndactly and chest wall abnormalitis. Had genetic counseling at MFM.) fetal growth restriction (EFW 9%ile at 28 weeks, scheduled [facility]. 19%ILE AT 30 WEEKS PER MFM (10/5) 27%ILE AT [facility] AT 34 WEEKS (10/30)) history of pre-eclampsia (2019 pregnancy, induced, recommend ASA) varicella non-immune (Offer vaccine pp). Pt endorse + Fm. Denies vaginal leakage. Denies vaginal bleeding. Denies feeling cxt's.   FWB: Cat 1 Fetal Tracing.   Plan: Admit to Birthing Suite per consult with Dr Su Hiltoberts Routine [facility] orders Pain  med/epidural prn Anticipate labor progression   [facility] J[facility] Butson NP-C, CNM,  MSN 04/21/2019, 1:57 AM

## 2019-04-21 ENCOUNTER — Inpatient Hospital Stay (HOSPITAL_COMMUNITY): Payer: Medicaid Other | Admitting: Anesthesiology

## 2019-04-21 ENCOUNTER — Encounter (HOSPITAL_COMMUNITY): Payer: Self-pay

## 2019-04-21 ENCOUNTER — Inpatient Hospital Stay (HOSPITAL_COMMUNITY)
Admission: AD | Admit: 2019-04-21 | Discharge: 2019-04-23 | DRG: 807 | Disposition: A | Discharge status: Home / Self Care | Payer: Medicaid Other | Attending: Obstetrics & Gynecology | Admitting: Obstetrics & Gynecology

## 2019-04-21 ENCOUNTER — Inpatient Hospital Stay (HOSPITAL_COMMUNITY): Payer: Medicaid Other

## 2019-04-21 DIAGNOSIS — O365930 Maternal care for other known or suspected poor fetal growth, third trimester, not applicable or unspecified: Secondary | ICD-10-CM | POA: Diagnosis present

## 2019-04-21 DIAGNOSIS — Z20828 Contact with and (suspected) exposure to other viral communicable diseases: Secondary | ICD-10-CM | POA: Diagnosis present

## 2019-04-21 DIAGNOSIS — O134 Gestational [pregnancy-induced] hypertension without significant proteinuria, complicating childbirth: Secondary | ICD-10-CM | POA: Diagnosis present

## 2019-04-21 DIAGNOSIS — Z3490 Encounter for supervision of normal pregnancy, unspecified, unspecified trimester: Secondary | ICD-10-CM | POA: Diagnosis present

## 2019-04-21 DIAGNOSIS — Z3A37 37 weeks gestation of pregnancy: Secondary | ICD-10-CM

## 2019-04-21 DIAGNOSIS — Z87891 Personal history of nicotine dependence: Secondary | ICD-10-CM | POA: Diagnosis not present

## 2019-04-21 DIAGNOSIS — J45909 Unspecified asthma, uncomplicated: Secondary | ICD-10-CM | POA: Diagnosis present

## 2019-04-21 DIAGNOSIS — O9952 Diseases of the respiratory system complicating childbirth: Secondary | ICD-10-CM | POA: Diagnosis present

## 2019-04-21 DIAGNOSIS — O139 Gestational [pregnancy-induced] hypertension without significant proteinuria, unspecified trimester: Secondary | ICD-10-CM | POA: Diagnosis not present

## 2019-04-21 LAB — CBC
HCT: 31.2 % — ABNORMAL LOW (ref 36.0–46.0)
HCT: 29.2 % — ABNORMAL LOW (ref 36.0–46.0)
Hemoglobin: 10.2 g/dL — ABNORMAL LOW (ref 12.0–15.0)
Hemoglobin: 9.9 g/dL — ABNORMAL LOW (ref 12.0–15.0)
MCH: 28.6 pg (ref 26.0–34.0)
MCH: 28.5 pg (ref 26.0–34.0)
MCHC: 32.7 g/dL (ref 30.0–36.0)
MCHC: 33.9 g/dL (ref 30.0–36.0)
MCV: 87.4 fL (ref 80.0–100.0)
MCV: 84.1 fL (ref 80.0–100.0)
Platelets: 268 10*3/uL (ref 150–400)
Platelets: 246 10*3/uL (ref 150–400)
RBC: 3.57 MIL/uL — ABNORMAL LOW (ref 3.87–5.11)
RBC: 3.47 MIL/uL — ABNORMAL LOW (ref 3.87–5.11)
RDW: 11.9 % (ref 11.5–15.5)
RDW: 11.9 % (ref 11.5–15.5)
WBC: 6.8 10*3/uL (ref 4.0–10.5)
WBC: 5.9 10*3/uL (ref 4.0–10.5)
nRBC: 0 % (ref 0.0–0.2)
nRBC: 0 % (ref 0.0–0.2)

## 2019-04-21 LAB — TYPE AND SCREEN
ABO/RH(D): B POS
Antibody Screen: NEGATIVE

## 2019-04-21 LAB — RPR: RPR Ser Ql: NONREACTIVE

## 2019-04-21 LAB — ABO/RH: ABO/RH(D): B POS

## 2019-04-21 MED ORDER — DIBUCAINE (PERIANAL) 1 % EX OINT: 1.0000 "application " | TOPICAL_OINTMENT | CUTANEOUS | Status: DC | PRN

## 2019-04-21 MED ORDER — SODIUM CHLORIDE 0.9 % IV SOLN
3.0000 g | Freq: Once | INTRAVENOUS | Status: AC
  Administered 2019-04-21: 12:00:00 3 g via INTRAVENOUS
  Filled 2019-04-21: qty 8

## 2019-04-21 MED ORDER — LACTATED RINGERS IV SOLN: 500.0000 mL | INTRAVENOUS | Status: DC | PRN

## 2019-04-21 MED ORDER — LIDOCAINE HCL (PF) 1 % IJ SOLN
INTRAMUSCULAR | Status: DC | PRN
  Administered 2019-04-21: 5 mL via EPIDURAL

## 2019-04-21 MED ORDER — IBUPROFEN 600 MG PO TABS
600.0000 mg | ORAL_TABLET | Freq: Four times a day (QID) | ORAL | Status: DC
  Administered 2019-04-21 – 2019-04-23 (×7): 600 mg via ORAL
  Filled 2019-04-21 (×7): qty 1

## 2019-04-21 MED ORDER — LIDOCAINE HCL (PF) 1 % IJ SOLN: 30.0000 mL | INTRAMUSCULAR | Status: DC | PRN

## 2019-04-21 MED ORDER — LACTATED RINGERS IV SOLN: INTRAVENOUS | Status: DC

## 2019-04-21 MED ORDER — ONDANSETRON HCL 4 MG/2ML IJ SOLN: 4.0000 mg | INTRAMUSCULAR | Status: DC | PRN

## 2019-04-21 MED ORDER — OXYTOCIN BOLUS FROM INFUSION
500.0000 mL | Freq: Once | INTRAVENOUS | Status: AC
  Administered 2019-04-21: 500 mL via INTRAVENOUS

## 2019-04-21 MED ORDER — OXYTOCIN 40 UNITS IN NORMAL SALINE INFUSION - SIMPLE MED: 1.0000 m[IU]/min | INTRAVENOUS | Status: DC

## 2019-04-21 MED ORDER — MISOPROSTOL 25 MCG QUARTER TABLET
ORAL_TABLET | ORAL | Status: AC
  Administered 2019-04-21: 25 ug
  Filled 2019-04-21: qty 1

## 2019-04-21 MED ORDER — OXYCODONE-ACETAMINOPHEN 5-325 MG PO TABS: 1.0000 | ORAL_TABLET | ORAL | Status: DC | PRN

## 2019-04-21 MED ORDER — LACTATED RINGERS IV SOLN
INTRAVENOUS | Status: DC
  Administered 2019-04-21: 01:00:00 via INTRAVENOUS

## 2019-04-21 MED ORDER — SOD CITRATE-CITRIC ACID 500-334 MG/5ML PO SOLN: 30.0000 mL | ORAL | Status: DC | PRN

## 2019-04-21 MED ORDER — FLEET ENEMA 7-19 GM/118ML RE ENEM: 1.0000 | ENEMA | RECTAL | Status: DC | PRN

## 2019-04-21 MED ORDER — ACETAMINOPHEN 325 MG PO TABS: 650.0000 mg | ORAL_TABLET | ORAL | Status: DC | PRN

## 2019-04-21 MED ORDER — PRENATAL MULTIVITAMIN CH
1.0000 | ORAL_TABLET | Freq: Every day | ORAL | Status: DC
  Administered 2019-04-22: 13:00:00 1 via ORAL
  Filled 2019-04-21: qty 1

## 2019-04-21 MED ORDER — EPHEDRINE 5 MG/ML INJ: 10.0000 mg | INTRAVENOUS | Status: DC | PRN

## 2019-04-21 MED ORDER — BENZOCAINE-MENTHOL 20-0.5 % EX AERO
1.0000 "application " | INHALATION_SPRAY | CUTANEOUS | Status: DC | PRN
  Administered 2019-04-21: 1 via TOPICAL
  Filled 2019-04-21: qty 56

## 2019-04-21 MED ORDER — OXYTOCIN 40 UNITS IN NORMAL SALINE INFUSION - SIMPLE MED: 2.5000 [IU]/h | INTRAVENOUS | Status: DC

## 2019-04-21 MED ORDER — ONDANSETRON HCL 4 MG/2ML IJ SOLN: 4.0000 mg | Freq: Four times a day (QID) | INTRAMUSCULAR | Status: DC | PRN

## 2019-04-21 MED ORDER — ZOLPIDEM TARTRATE 5 MG PO TABS: 5.0000 mg | ORAL_TABLET | Freq: Every evening | ORAL | Status: DC | PRN

## 2019-04-21 MED ORDER — OXYCODONE-ACETAMINOPHEN 5-325 MG PO TABS: 2.0000 | ORAL_TABLET | ORAL | Status: DC | PRN

## 2019-04-21 MED ORDER — PHENYLEPHRINE 40 MCG/ML (10ML) SYRINGE FOR IV PUSH (FOR BLOOD PRESSURE SUPPORT): 80.0000 ug | PREFILLED_SYRINGE | INTRAVENOUS | Status: DC | PRN

## 2019-04-21 MED ORDER — WITCH HAZEL-GLYCERIN EX PADS: 1.0000 "application " | MEDICATED_PAD | CUTANEOUS | Status: DC | PRN

## 2019-04-21 MED ORDER — SIMETHICONE 80 MG PO CHEW: 80.0000 mg | CHEWABLE_TABLET | ORAL | Status: DC | PRN

## 2019-04-21 MED ORDER — DIPHENHYDRAMINE HCL 50 MG/ML IJ SOLN: 12.5000 mg | INTRAMUSCULAR | Status: DC | PRN

## 2019-04-21 MED ORDER — MISOPROSTOL 200 MCG PO TABS
1000.0000 ug | ORAL_TABLET | Freq: Once | ORAL | Status: AC
  Administered 2019-04-21: 1000 ug via RECTAL

## 2019-04-21 MED ORDER — TERBUTALINE SULFATE 1 MG/ML IJ SOLN: 0.2500 mg | Freq: Once | INTRAMUSCULAR | Status: DC | PRN

## 2019-04-21 MED ORDER — OXYTOCIN 40 UNITS IN NORMAL SALINE INFUSION - SIMPLE MED
1.0000 m[IU]/min | INTRAVENOUS | Status: DC
  Administered 2019-04-21: 2 m[IU]/min via INTRAVENOUS

## 2019-04-21 MED ORDER — TETANUS-DIPHTH-ACELL PERTUSSIS 5-2.5-18.5 LF-MCG/0.5 IM SUSP: 0.5000 mL | Freq: Once | INTRAMUSCULAR | Status: DC

## 2019-04-21 MED ORDER — PHENYLEPHRINE 40 MCG/ML (10ML) SYRINGE FOR IV PUSH (FOR BLOOD PRESSURE SUPPORT)
80.0000 ug | PREFILLED_SYRINGE | INTRAVENOUS | Status: DC | PRN
  Filled 2019-04-21: qty 10

## 2019-04-21 MED ORDER — FENTANYL CITRATE (PF) 100 MCG/2ML IJ SOLN: 50.0000 ug | INTRAMUSCULAR | Status: DC | PRN

## 2019-04-21 MED ORDER — LACTATED RINGERS AMNIOINFUSION
INTRAVENOUS | Status: DC
  Administered 2019-04-21: 11:00:00 via INTRAUTERINE

## 2019-04-21 MED ORDER — SODIUM CHLORIDE (PF) 0.9 % IJ SOLN
INTRAMUSCULAR | Status: DC | PRN
  Administered 2019-04-21: 12 mL/h via EPIDURAL

## 2019-04-21 MED ORDER — ONDANSETRON HCL 4 MG PO TABS: 4.0000 mg | ORAL_TABLET | ORAL | Status: DC | PRN

## 2019-04-21 MED ORDER — OXYTOCIN 40 UNITS IN NORMAL SALINE INFUSION - SIMPLE MED
1.0000 m[IU]/min | INTRAVENOUS | Status: DC
  Filled 2019-04-21: qty 1000

## 2019-04-21 MED ORDER — DIPHENHYDRAMINE HCL 25 MG PO CAPS: 25.0000 mg | ORAL_CAPSULE | Freq: Four times a day (QID) | ORAL | Status: DC | PRN

## 2019-04-21 MED ORDER — SENNOSIDES-DOCUSATE SODIUM 8.6-50 MG PO TABS
2.0000 | ORAL_TABLET | ORAL | Status: DC
  Administered 2019-04-22 – 2019-04-23 (×2): 2 via ORAL
  Filled 2019-04-21 (×2): qty 2

## 2019-04-21 MED ORDER — COCONUT OIL OIL: 1.0000 "application " | TOPICAL_OIL | Status: DC | PRN

## 2019-04-21 MED ORDER — FENTANYL CITRATE (PF) 100 MCG/2ML IJ SOLN
50.0000 ug | INTRAMUSCULAR | Status: DC | PRN
  Administered 2019-04-21: 100 ug via INTRAVENOUS
  Filled 2019-04-21: qty 2

## 2019-04-21 MED ORDER — MISOPROSTOL 200 MCG PO TABS
ORAL_TABLET | ORAL | Status: AC
  Filled 2019-04-21: qty 5

## 2019-04-21 MED ORDER — LACTATED RINGERS IV SOLN
500.0000 mL | INTRAVENOUS | Status: DC | PRN
  Administered 2019-04-21: 10:00:00 500 mL via INTRAVENOUS

## 2019-04-21 MED ORDER — FENTANYL-BUPIVACAINE-NACL 0.5-0.125-0.9 MG/250ML-% EP SOLN
12.0000 mL/h | EPIDURAL | Status: DC | PRN
  Filled 2019-04-21: qty 250

## 2019-04-21 MED ORDER — LACTATED RINGERS IV SOLN: 500.0000 mL | Freq: Once | INTRAVENOUS | Status: DC

## 2019-04-21 MED ORDER — ACETAMINOPHEN 325 MG PO TABS
650.0000 mg | ORAL_TABLET | ORAL | Status: DC | PRN
  Administered 2019-04-22: 650 mg via ORAL
  Filled 2019-04-21: qty 2

## 2019-04-21 MED ORDER — OXYTOCIN BOLUS FROM INFUSION: 500.0000 mL | Freq: Once | INTRAVENOUS | Status: DC

## 2019-04-21 MED ORDER — MISOPROSTOL 25 MCG QUARTER TABLET
25.0000 ug | ORAL_TABLET | ORAL | Status: DC | PRN
  Administered 2019-04-21: 25 ug via VAGINAL
  Filled 2019-04-21: qty 1

## 2019-04-21 NOTE — Anesthesia Preprocedure Evaluation (Signed)
Anesthesia Evaluation  Patient identified by MRN, date of birth, ID band Patient awake    Reviewed: Allergy & Precautions, NPO status , Patient's Chart, lab work & pertinent test results  Airway Mallampati: II  TM Distance: >3 FB Neck ROM: Full    Dental no notable dental hx. (+) Teeth Intact   Pulmonary asthma , former smoker,    Pulmonary exam normal breath sounds clear to auscultation       Cardiovascular hypertension, Normal cardiovascular exam Rhythm:Regular Rate:Normal     Neuro/Psych negative neurological ROS  negative psych ROS   GI/Hepatic negative GI ROS,   Endo/Other  negative endocrine ROS  Renal/GU      Musculoskeletal   Abdominal (+) + obese,   Peds  Hematology Hgb 9.9 Plts 246 T&S   Anesthesia Other Findings   Reproductive/Obstetrics (+) Pregnancy                             Anesthesia Physical Anesthesia Plan  ASA: III  Anesthesia Plan: Epidural   Post-op Pain Management:    Induction:   PONV Risk Score and Plan:   Airway Management Planned:   Additional Equipment:   Intra-op Plan:   Post-operative Plan:   Informed Consent: I have reviewed the patients History and Physical, chart, labs and discussed the procedure including the risks, benefits and alternatives for the proposed anesthesia with the patient or authorized representative who has indicated his/her understanding and acceptance.       Plan Discussed with:   Anesthesia Plan Comments: (37 1/7 wk G2P1 w IUGR for LEA)        Anesthesia Quick Evaluation

## 2019-04-21 NOTE — Progress Notes (Signed)
Labor Progress Note  Sylvia Chambers is a 23 y.o. female, G2P1001, IUP at 67 weeks, presenting for IOL for IUGR & 2VC, EFW 27%, 5lbs on 10/30, lagginf FL<2%, BPP 8/8, doppler WNL, GBS-, low risk female. PNH: asthma (stable, albuterol inhaler); atypical squamous cells of undetermined significance (With positive HPV on pap 11/13/18--plan repeat in 1 year.); disorder of umbilical cord ([place]. GROWTH Q FOUR WEEKS.  PANORAMA WNL.  PT SEEN BY MFM.  [place] CONFIRMED. NO ANAMOLIES SEEN. PT ASKED FOR GENETIC COUNSELING.  AND WAS SCHEDULED.); family history of Congenital anomaly (Son with [place] Sequence, with syndactly and chest wall abnormalitis. Had genetic counseling at MFM.) fetal growth restriction (EFW 9%ile at 28 weeks, scheduled for f/u growth and dopplers at [facility] 03/07/20. 19%ILE AT 30 WEEKS PER MFM (10/5) 27%ILE AT [facility] AT 34 WEEKS (10/30)) history of pre-eclampsia (2019 pregnancy, induced, recommend ASA) varicella non-immune (Offer vaccine pp)  Subjective: Pt resting in bed in NAD, stable feeling cxt and requesting epidural now. Pt questions were answered about induction process.    Patient Active Problem List   Diagnosis Date Noted  . IUGR (intrauterine growth restriction) affecting care of mother, third trimester, not applicable or unspecified fetus 04/21/2019  . Preeclampsia 10/16/2017   Objective: BP 134/79   Pulse 73   Temp 98 F (36.7 C)   Resp 17   Ht 5\' 3"  (1.6 m)   Wt 90.3 kg   LMP 08/05/2018   BMI 35.25 kg/m  No intake/output data recorded. No intake/output data recorded. NST: FHR baseline 135 bpm, Variability: moderate, Accelerations:present, Decelerations:  Absent= Cat 1/Reactive CTX:  irregular, every 2-4 minutes, lasting 60/80 seconds Uterus gravid, soft non tender, mild to palpate with contractions.  SVE:  Dilation: 2 Effacement (%): 40 Station: -2 Exam by:: [employer] RN   Assessment:  Sylvia Chambers is a 23 y.o. female, G2P1001, IUP at 63 weeks, presenting for IOL for  IUGR & 2VC, EFW 27%, 5lbs on 10/30, lagginf FL<2%, BPP 8/8, doppler WNL, GBS-, low risk female. PNH: asthma (stable, albuterol inhaler); atypical squamous cells of undetermined significance (With positive HPV on pap 11/13/18--plan repeat in 1 year.); disorder of umbilical cord ([place]. GROWTH Q FOUR WEEKS.  PANORAMA WNL.  PT SEEN BY MFM.  [place] CONFIRMED. NO ANAMOLIES SEEN. PT ASKED FOR GENETIC COUNSELING.  AND WAS SCHEDULED.); family history of Congenital anomaly (Son with [place] Sequence, with syndactly and chest wall abnormalitis. Had genetic counseling at MFM.) fetal growth restriction (EFW 9%ile at 28 weeks, scheduled for f/u growth and dopplers at [facility] 03/07/20. 19%ILE AT 30 WEEKS PER MFM (10/5) 27%ILE AT [facility] AT 34 WEEKS (10/30)) history of pre-eclampsia (2019 pregnancy, induced, recommend ASA) varicella non-immune (Offer vaccine pp). Pt progressing in early labor on second cytotec. Requesting epidural now. Patient Active Problem List   Diagnosis Date Noted  . IUGR (intrauterine growth restriction) affecting care of mother, third trimester, not applicable or unspecified fetus 04/21/2019  . Preeclampsia 10/16/2017   NICHD: Category 1  Membranes:  Intact, no s/s of infection  Induction:    Cytotec x 0110 & 0515  Pain management:               IV pain management: x 0635 x1 dose fentanyl              Epidural placement: to be placed now  GBS negative  Plan: Continue labor plan Continuous monitoring Rest Ambulate Frequent position changes to facilitate fetal rotation and descent. Dr Alwyn Pea will reassess  with cervical exam at 9 or earlier if necessar Start pitocin per protocol at next check  Anticipate labor progression and vaginal delivery.   Md Pinn aware of plan and verbalized agreement.   Dale [place], NP-C, CNM, MSN 04/21/2019. 5:33 AM

## 2019-04-21 NOTE — Lactation Note (Signed)
This note was copied from a baby's chart. Lactation Consultation Note  Patient Name: Sylvia Chambers OVFIE'P Date: 04/21/2019 Reason for consult: Initial assessment;Infant < 6lbs;Early term 37-38.6wks  Preterm baby <6lbs, swaddled by mom and grandma as LC entered the room. Mom stated she was interested in breastfeeding and bottle feeding. MOB shared with LC that she has experience with breastfeeding first child that is 22 years old.   MOB requested information to receive a pump via [place]. LC will put in a request for [facility] to follow-up.  MOB and grandmother asked LC how often should she pump. [place] educated MOB and grandma about pumping after every feed or atleast 8x's a day (every 3 hours). MOB stated she understands and did not have any further questions.  LPI protocol reviewed due to infant birth weight.    Interior educated on STS, feeding 8-12 x's a day, pumping, and frequent feeds. Mom stated she was aware and no further questions were asked. [place] asked mom to call if needed.  Feeding plan:  -Offer BF baby 8-12 x's a day -Supplement the baby after feeding following LPI protocol  -offer EBM first before offering ABM -Breastfeed 8-12 x's a day  *Noted by P2P- approved by LC-HL.   Maternal Data Has patient been taught Hand Expression?: Yes Does the patient have breastfeeding experience prior to this delivery?: Yes   Interventions Interventions: Breast feeding basics reviewed;Hand express  Lactation Tools Discussed/Used WIC Program: Yes Pump Review: Milk Storage Initiated by:: misty may  Date initiated:: 04/21/19   Consult Status Consult Status: Follow-up Date: 04/22/19 Follow-up type: In-patient    Lenore Manner 04/21/2019, 10:15 PM

## 2019-04-21 NOTE — Anesthesia Procedure Notes (Signed)

## 2019-04-21 NOTE — Progress Notes (Signed)
Sylvia Chambers is a 23 y.o. G2P1001 at [redacted]w[redacted]d by LMP admitted for induction of labor due to Poor fetal growth / IUGR  Subjective: Patient comfortable with epidural  Objective: BP (!) 147/101   Pulse 95   Temp 98 F (36.7 C) (Oral)   Resp 18   Ht 5\' 3"  (1.6 m)   Wt 90.3 kg   LMP 08/05/2018   SpO2 99%   BMI 35.25 kg/m  No intake/output data recorded. No intake/output data recorded.  FHT:  FHR: 155 bpm, variability: moderate,  accelerations:  Present,  decelerations:  Present variable decels with contractions, lasting 1-2 minutes and return to baseline UC:   irregular, every 1-5 minutes SVE:   Dilation: 5 Effacement (%): 70 Station: -1 Exam by:: Sylvia Chambers AROM clear fluid, IUPC and FSE placed.   Labs: Lab Results  Component Value Date   WBC 5.9 04/21/2019   HGB 9.9 (L) 04/21/2019   HCT 29.2 (L) 04/21/2019   MCV 84.1 04/21/2019   PLT 246 04/21/2019    Assessment / Plan: Induction of labor due to IUGR,  progressing well on pitocin  Labor: Progressing normally Preeclampsia:  no signs or symptoms of toxicity Fetal Wellbeing:  Category II  - Will start amnioinfusion for variable decelerations Pain Control:  Epidural I/D:  n/a Anticipated MOD:  NSVD  Sylvia Chambers Sylvia Chambers 04/21/2019, 11:10 AM

## 2019-04-22 LAB — CBC
HCT: 31.4 % — ABNORMAL LOW (ref 36.0–46.0)
Hemoglobin: 10.4 g/dL — ABNORMAL LOW (ref 12.0–15.0)
MCH: 28.1 pg (ref 26.0–34.0)
MCHC: 33.1 g/dL (ref 30.0–36.0)
MCV: 84.9 fL (ref 80.0–100.0)
Platelets: 262 10*3/uL (ref 150–400)
RBC: 3.7 MIL/uL — ABNORMAL LOW (ref 3.87–5.11)
RDW: 11.8 % (ref 11.5–15.5)
WBC: 7.4 10*3/uL (ref 4.0–10.5)
nRBC: 0 % (ref 0.0–0.2)

## 2019-04-22 NOTE — Anesthesia Postprocedure Evaluation (Signed)
Anesthesia Post Note  Patient: Aaira J Reede  Procedure(s) Performed: AN AD Tice     Patient location during evaluation: Mother Baby Anesthesia Type: Epidural Level of consciousness: awake and alert and oriented Pain management: satisfactory to patient Vital Signs Assessment: post-procedure vital signs reviewed and stable Respiratory status: respiratory function stable Cardiovascular status: stable Postop Assessment: no headache, no backache, epidural receding, patient able to bend at knees, no signs of nausea or vomiting and adequate PO intake Anesthetic complications: no    Last Vitals:  Vitals:   04/21/19 1908 04/22/19 0137  BP: 129/87 119/77  Pulse: 86 76  Resp: 18 18  Temp: 36.9 C 36.9 C  SpO2:  99%    Last Pain:  Vitals:   04/22/19 0137  TempSrc: Oral  PainSc: 3    Pain Goal:                   Dave Mannes

## 2019-04-22 NOTE — Lactation Note (Signed)
This note was copied from a baby's chart. Lactation Consultation Note  Patient Name: Sylvia Chambers [id number] Date: 04/22/2019 Reason for consult: Follow-up assessment;Difficult latch;Early term 65-38.6wks  [phone] F/U visit with P2 mom who delivered @ 37wks, baby is now 6 hours old with 4% wt loss  Mom reports breastfeeding going much better today and she can latch baby on better today. Mom states she is following the LPTI feeding guidelines and offering the breast first, followed by a bottle via paced bottle feeding, and then pumping. Praised mom for her efforts and dedication to feeding her baby.  RN currently at bedside and assisted with textbook latch and positioning at the breast prior to [facility] entrance to room. Infant actively suckling in football hold on right breast. Mom sitting up off back of bed, encouraged mom to find comfortable position for the duration of breastfeeding and prevent undue stress on her back.  Reviewed feeding 8-12 times in 24 hours and with feeding cues. Reviewed waking infant if no feeding cues after 4 hours. Reviewed ways to wake a sleepy baby such as removing clothing, changing diaper, STS contact, and hand expression. Reviewed expected I & O and checking for wet diapers via the blue stripe in the diaper. Reviewed limiting latch attempts to 5 minutes and limiting time at the breast to 30 minutes. Reassured mom that supplementation may only be temporary. Reviewed IP/OP lactation services. Mom is active with [facility]; referral faxed.  Maternal Data Has patient been taught Hand Expression?: Yes  Feeding Feeding Type: Breast Fed  LATCH Score Latch: Grasps breast easily, tongue down, lips flanged, rhythmical sucking.  Audible Swallowing: Spontaneous and intermittent  Type of Nipple: Everted at rest and after stimulation  Comfort (Breast/Nipple): Soft / non-tender  Hold (Positioning): Assistance needed to correctly position infant at breast and  maintain latch.  LATCH Score: 9  Interventions Interventions: Breast feeding basics reviewed;Skin to skin;Breast massage;Hand express;Position options;Support pillows;DEBP  Lactation Tools Discussed/Used WIC Program: Yes   Consult Status Consult Status: Follow-up Date: 04/23/19 Follow-up type: In-patient    Cranston Neighbor 04/22/2019, 3:19 PM

## 2019-04-22 NOTE — Progress Notes (Signed)
PPD# 1 SVD w/ intact perineum Information for the patient's newborn:  Sylvia Chambers, Sylvia Chambers [[id number]]  female    Baby Boy [place] Circumcision out patient with Pediatrician   S:   Reports feeling good Tolerating PO fluid and solids No nausea or vomiting Bleeding is light Pain controlled with PO meds Up ad lib / ambulatory / voiding w/o difficulty Feeding: Bottle    O:   VS: BP 119/77 (BP Location: Right Arm)   Pulse 76   Temp 98.5 F (36.9 C) (Oral)   Resp 18   Ht 5\' 3"  (1.6 m)   Wt 90.3 kg   LMP 08/05/2018   SpO2 99%   Breastfeeding Unknown   BMI 35.25 kg/m   LABS:  Recent Labs    04/21/19 0647 04/22/19 0515  WBC 5.9 7.4  HGB 9.9* 10.4*  PLT 246 262   Blood type: --/--/B POS, B POS Performed at [facility] [address] [place], [place] [address]  [phone]) Rubella: Immune (06/02 0000)                      I&O: Intake/Output      11/18 0701 - 11/19 0700 11/19 0701 - 11/20 0700   Urine (mL/kg/hr) 600 (0.3)    Blood 500    Total Output 1100    Net -1100         Urine Occurrence 1 x      Physical Exam: Alert and oriented X3 Lungs: Clear and unlabored Heart: regular rate and rhythm / no mumurs Abdomen: soft, non-tender, non-distended  Fundus: firm, non-tender, U-2 Perineum: intact, non-edematous Lochia: appropraite Extremities: no edema, no calf pain or tenderness    A:  PPD # 1     -S/P SVD after IOL for IUGR Normal exam  P:  Routine post partum orders Up ad lib Anticipate D/C on 04/23/19   Plan reviewed w/ Dr. Burna Cash, MSN, CNM 04/22/2019, 9:48 AM

## 2019-04-23 DIAGNOSIS — O139 Gestational [pregnancy-induced] hypertension without significant proteinuria, unspecified trimester: Secondary | ICD-10-CM | POA: Diagnosis not present

## 2019-04-23 LAB — SURGICAL PATHOLOGY

## 2019-04-23 MED ORDER — NIFEDIPINE ER OSMOTIC RELEASE 30 MG PO TB24: 30.0000 mg | ORAL_TABLET | Freq: Every day | ORAL | Status: DC

## 2019-04-23 MED ORDER — NIFEDIPINE ER 30 MG PO TB24: 30.0000 mg | ORAL_TABLET | Freq: Every day | ORAL | 0 refills | Status: AC

## 2019-04-23 NOTE — Lactation Note (Signed)
This note was copied from a baby's chart. Lactation Consultation Note  Patient Name: Sylvia Chambers IRWER'X Date: 04/23/2019 Reason for consult: Follow-up assessment;Early term 37-38.6wks;Infant < 6lbs Baby is 45 hours old/6% weight loss.  Mom states baby is latching well.  She allows baby to breastfeed prior to supplementing with EBM/formula.  Mom is also pumping .  She is currently pumping with manual pump and has obtained 15 mls.  Reminded to give expressed milk back to baby.  Discussed milk coming to volume and the prevention and treatment of engorgement.  [facility] referral has been faxed for a breast pump.  Instructed to call and see what time she can pick pump up.  Reviewed outpatient services and encouraged to call prn.  Maternal Data    Feeding Feeding Type: Bottle Fed - Formula Nipple Type: Slow - flow  LATCH Score                   Interventions    Lactation Tools Discussed/Used     Consult Status Consult Status: Complete Follow-up type: Call as needed    Ave Filter 04/23/2019, 8:40 AM

## 2019-04-23 NOTE — Discharge Summary (Signed)
SVD OB Discharge Summary     Patient Name: Sylvia Chambers DOB: [birth year] MRN: [id number]  Date of admission: 04/21/2019 Delivering MD: Essie Hart  Date of delivery: 04/21/2019 Type of delivery: SVD  Newborn Data: Sex: Baby female CIR: ou pt desired Live born female  Birth Weight: 5 lb 4.8 oz (2404 g) APGAR: 7, 9  Newborn Delivery   Birth date/time: 04/21/2019 11:27:00 Delivery type: Vaginal, Spontaneous      Feeding: breast and bottle Infant being discharge to home with mother in stable condition.   Admitting diagnosis: PREG Intrauterine pregnancy: [redacted]w[redacted]d     Secondary diagnosis:  Active Problems:   IUGR (intrauterine growth restriction) affecting care of mother, third trimester, not applicable or unspecified fetus   Encounter for induction of labor   Gestational hypertension   Normal postpartum course                                Complications: None                                                              Intrapartum Procedures: spontaneous vaginal delivery Postpartum Procedures: none Complications-Operative and Postpartum: none and GHTN Augmentation: AROM, Pitocin and Cytotec   History of Present Illness: Ms. Sylvia Chambers is a 23 y.o. female, G2P2002, who presents at [redacted]w[redacted]d weeks gestation. The patient has been followed at  [facility]  Her pregnancy has been complicated by:  Patient Active Problem List   Diagnosis Date Noted  . Gestational hypertension 04/23/2019  . Normal postpartum course 04/23/2019  . IUGR (intrauterine growth restriction) affecting care of mother, third trimester, not applicable or unspecified fetus 04/21/2019  . Encounter for induction of labor 04/21/2019  . Preeclampsia 10/16/2017    Hospital course:  Induction of Labor With Vaginal Delivery   23 y.o. yo G2P2002 at [redacted]w[redacted]d was admitted to the hospital 04/21/2019 for induction of labor.  Indication for induction: Gestational hypertension and  IUGR.  Patient had an uncomplicated labor course as follows: Membrane Rupture Time/Date: 10:21 AM ,04/21/2019   Intrapartum Procedures: Episiotomy: None [1]                                         Lacerations:  None [1]  Patient had delivery of a Viable infant.  Information for the patient's newborn:  Luverta, Korte [[id number]]      04/21/2019  Details of delivery can be found in separate delivery note.  Patient had a routine postpartum course. Patient is discharged home 04/23/19. Postpartum Day # 2 : S/P NSVD due to IUGR and 2VC. Patient up ad lib, denies syncope or dizziness. Reports consuming regular diet without issues and denies N/V. Patient reports 0 bowel movement + passing flatus.  Denies issues with urination and reports bleeding is "lighter."  Patient is breast and bottlefeeding and reports going well.  Desires undecided for postpartum contraception.  Pain is being appropriately managed with use of po meds. BP stable at but elevated to 127/98, last BP ranged 140/90s, started on procardia 30xl, pt to be discharge home on procardia and f/u  in office for BP check.  Denies HA< RUQ pain or vision changes.    Physical exam  Vitals:   04/22/19 1421 04/22/19 2123 04/22/19 2143 04/23/19 0528  BP: 131/89 116/68 (!) 142/89 (!) 125/93  Pulse: 66 75 69 79  Resp: 16   18  Temp: 97.8 F (36.6 C) 98.3 F (36.8 C) 98.6 F (37 C) 98.2 F (36.8 C)  TempSrc: Oral Tympanic Oral Oral  SpO2:  100% 98%   Weight:      Height:       General: alert, cooperative and no distress Lochia: appropriate Uterine Fundus: firm Perineum: Intact DVT Evaluation: No evidence of DVT seen on physical exam. Negative Homan's sign. No cords or calf tenderness. No significant calf/ankle edema.  Labs: Lab Results  Component Value Date   WBC 7.4 04/22/2019   HGB 10.4 (L) 04/22/2019   HCT 31.4 (L) 04/22/2019   MCV 84.9 04/22/2019   PLT 262 04/22/2019   CMP Latest Ref Rng & Units 09/10/2018  Glucose 70 -  99 mg/dL 95  BUN 6 - 20 mg/dL 7  Creatinine 0.44 - 1.00 mg/dL 0.73  Sodium 135 - 145 mmol/L 139  Potassium 3.5 - 5.1 mmol/L 3.6  Chloride 98 - 111 mmol/L 107  CO2 22 - 32 mmol/L 23  Calcium 8.9 - 10.3 mg/dL 9.4  Total Protein 6.5 - 8.1 g/dL 7.2  Total Bilirubin 0.3 - 1.2 mg/dL 0.6  Alkaline Phos 38 - 126 U/L 66  AST 15 - 41 U/L 15  ALT 0 - 44 U/L 10    Date of discharge: 04/23/2019 Discharge Diagnoses: Term Pregnancy-delivered and IUGR, GHTN Discharge instruction: per After Visit Summary and "Baby and Me Booklet".  After visit meds:   Activity:           unrestricted and pelvic rest Advance as tolerated. Pelvic rest for 6 weeks.  Diet:                routine Medications: PNV Postpartum contraception: Undecided Condition:  Pt discharge to home with baby in stable GHTN: F/u in office, report s/sx or preeclampsia.   Meds: Allergies as of 04/23/2019   No Known Allergies     Medication List    STOP taking these medications   Butalbital-APAP-Caffeine 50-300-40 MG Caps   cephALEXin 500 MG capsule Commonly known as: KEFLEX   prenatal multivitamin Tabs tablet   terconazole 0.4 % vaginal cream Commonly known as: TERAZOL 7     TAKE these medications   NIFEdipine 30 MG 24 hr tablet Commonly known as: ADALAT CC Take 1 tablet (30 mg total) by mouth daily.   Prenatal Complete 14-0.4 MG Tabs Take 1 tablet by mouth daily.       Discharge Follow Up:  Follow-up [facility] Follow up.   Specialty: Obstetrics and Gynecology Why: 1 weeks BP check and 6 weeks PPV Contact information: [address] [place] [id number] [place], NP-C, CNM 04/23/2019, 7:35 AM  Noralyn Pick, [place]

## 2020-01-17 IMAGING — US US MFM OB FOLLOW-UP
1 series · 13 of 28 positions shown · non-contrast
Comparison: none

[Series 1: us mfm ob follow-up · 13 of 114 slices shown]
[im 5/114]
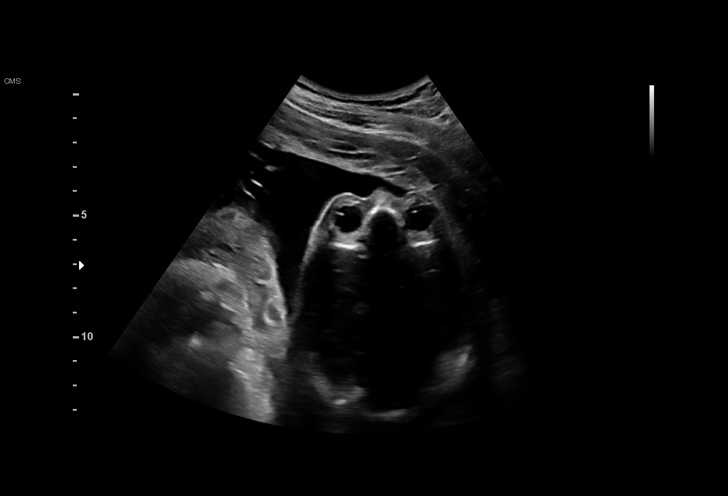
[im 13/114]
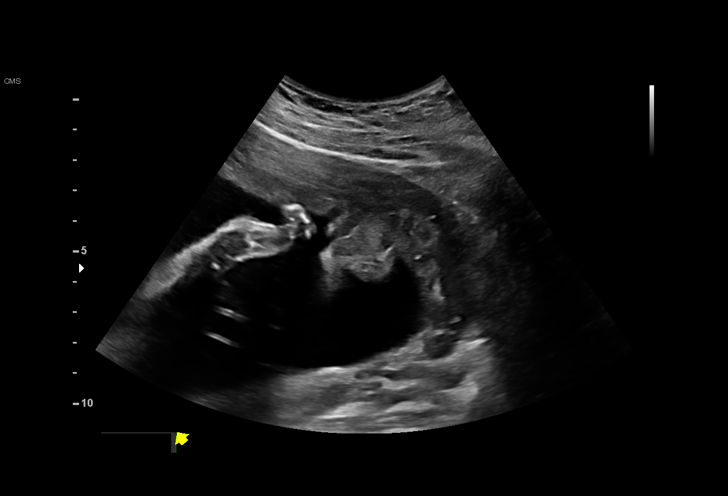
[im 21/114]
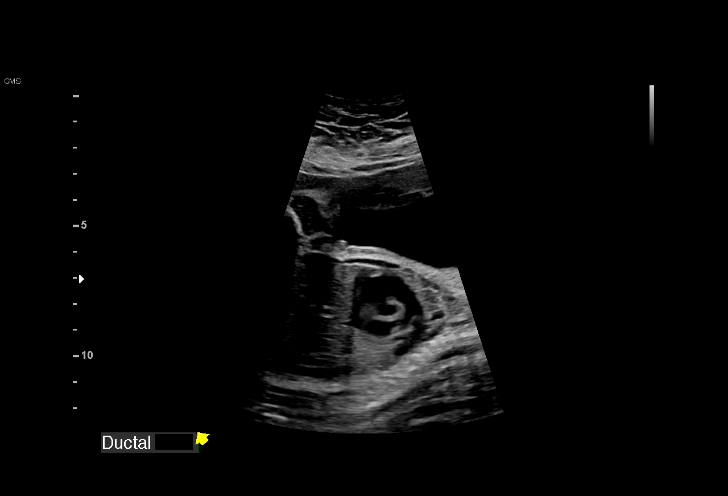
[im 30/114]
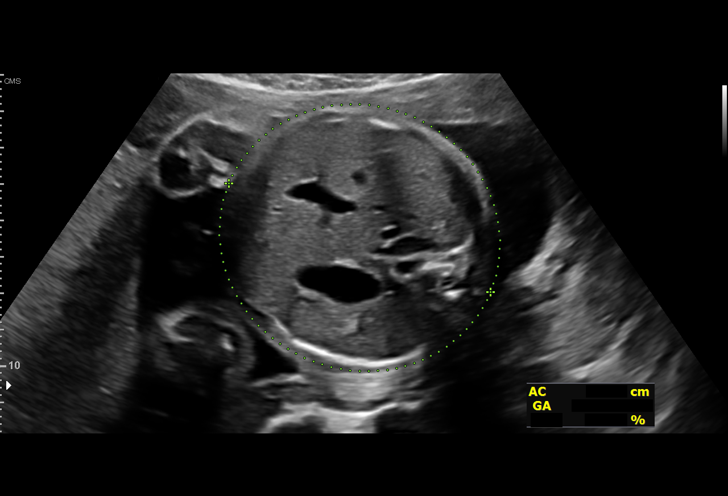
[im 38/114]
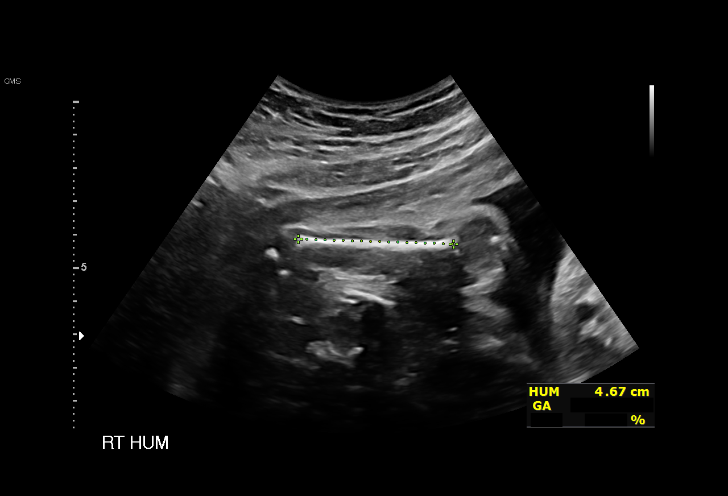
[im 47/114]
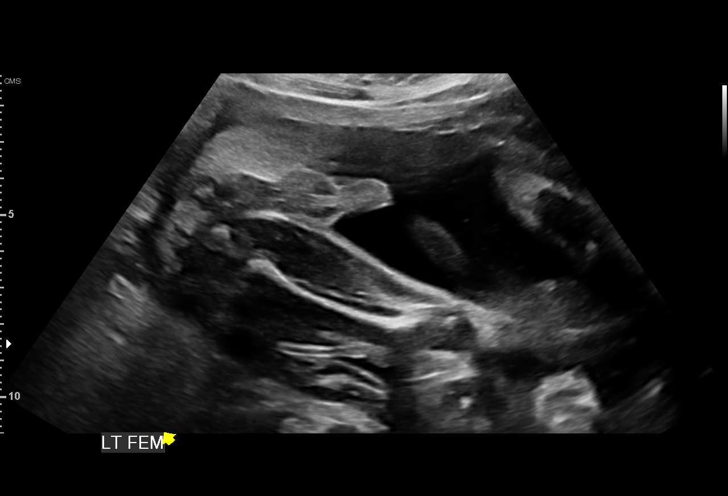
[im 59/114]
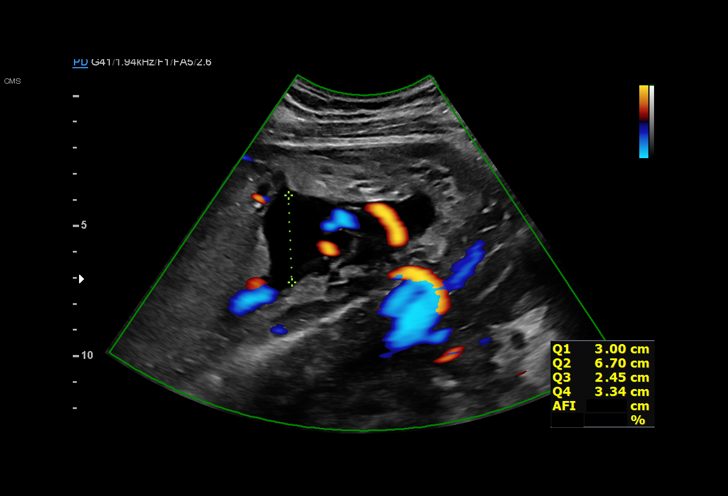
[im 67/114]
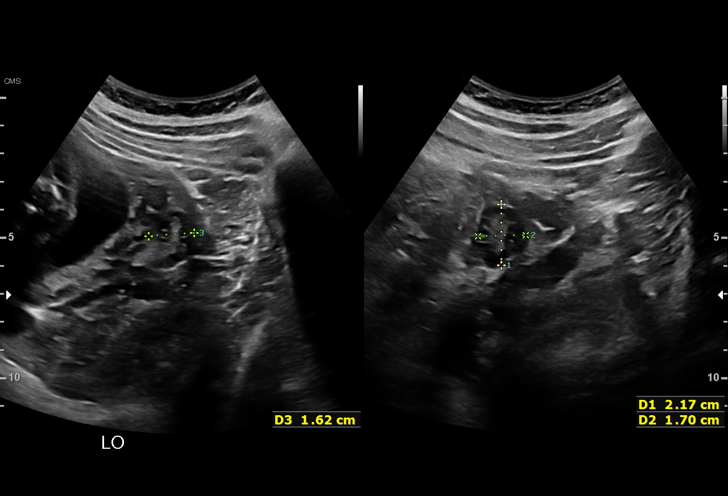
[im 76/114]
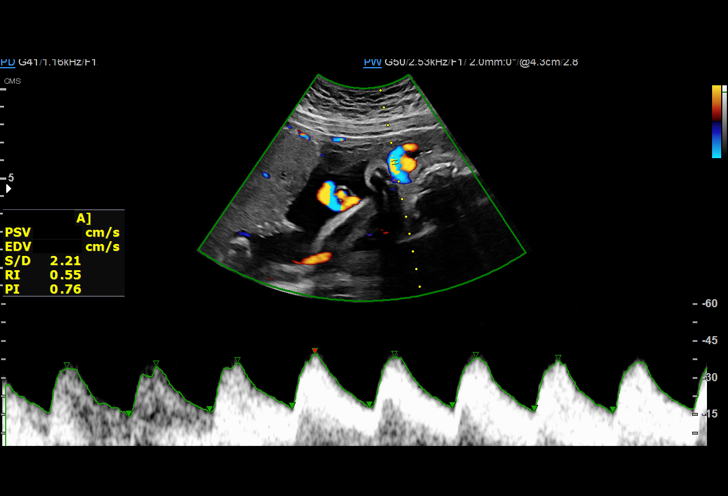
[im 84/114]
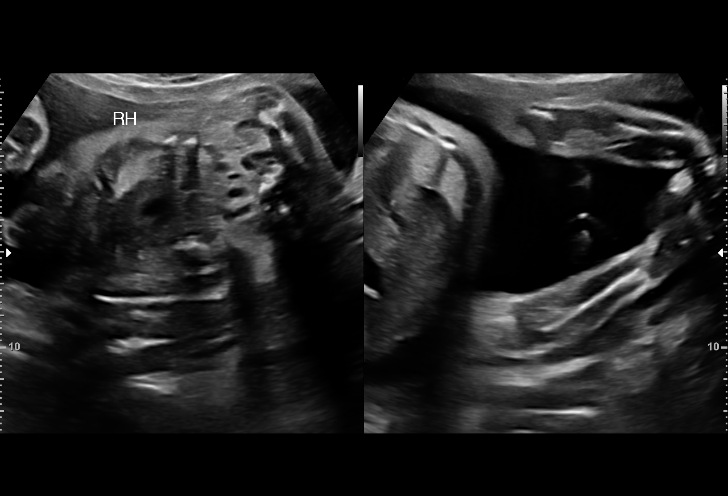
[im 93/114]
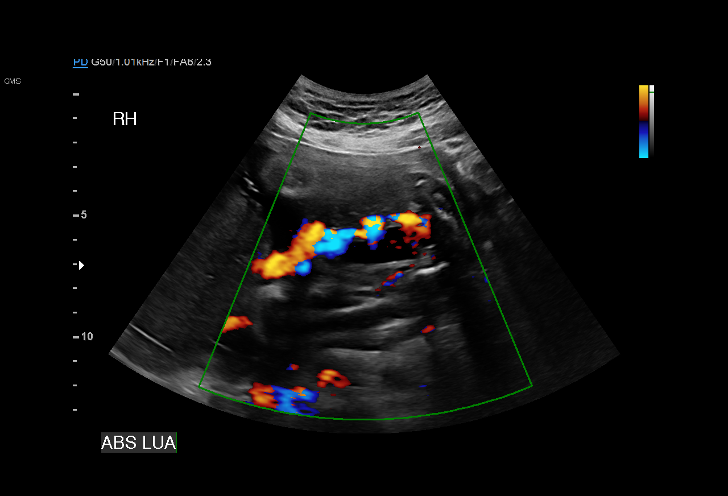
[im 101/114]
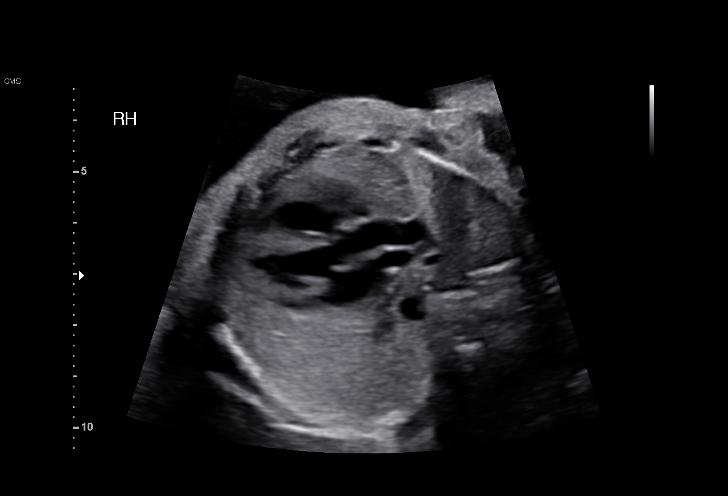
[im 109/114]
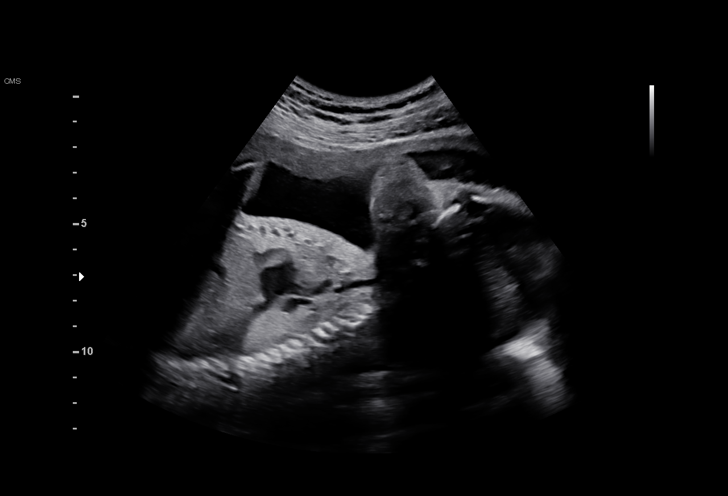

[13 of 28 positions shown; findings below may reference images not displayed]

Obstetrics &
                                                            Gynecology
                                                            [id number]
                                                            [place]

 ----------------------------------------------------------------------

 ----------------------------------------------------------------------
Indications

  Obesity complicating pregnancy, third
  trimester
  Family history of congenital anomaly ([place]
  syndrome)
  Poor obstetric history: Previous
  preeclampsia / eclampsia/gestational HTN
  28 weeks gestation of pregnancy
 ----------------------------------------------------------------------
Vital Signs

                                                Height:        5'3"
Fetal Evaluation

 Num Of Fetuses:         1
 Fetal Heart Rate(bpm):  161
 Cardiac Activity:       Observed
 Presentation:           Cephalic
 Placenta:               Right lateral

 Amniotic Fluid
 AFI FV:      Within normal limits

 AFI Sum(cm)     %Tile       Largest Pocket(cm)
 15.49           55
 RUQ(cm)       RLQ(cm)       LUQ(cm)        LLQ(cm)
 3
Biometry

 BPD:      74.4  mm     G. Age:  29w 6d         74  %    CI:        74.98   %    70 - 86
                                                         FL/HC:      17.7   %    19.6 -
 HC:      272.6  mm     G. Age:  29w 5d         49  %    HC/AC:      1.15        0.99 -
 AC:      236.1  mm     G. Age:  27w 6d         21  %    FL/BPD:     64.8   %    71 - 87
 FL:       48.2  mm     G. Age:  26w 1d        < 1  %    FL/AC:      20.4   %    20 - 24
 HUM:      47.4  mm     G. Age:  27w 6d         27  %

 Est. FW:    3313  gm      2 lb 7 oz      9  %
OB History

 Gravidity:    2         Term:   1
Gestational Age

 LMP:           28w 5d        Date:  08/05/18                 EDD:   05/12/19
 U/S Today:     28w 3d                                        EDD:   05/14/19
 Best:          28w 5d     Det. By:  LMP  (08/05/18)          EDD:   05/12/19
Anatomy

 Cranium:               Appears normal         LVOT:                   Appears normal
 Cavum:                 Previously seen        Aortic Arch:            Appears normal
 Ventricles:            Previously seen        Ductal Arch:            Appears normal
 Choroid Plexus:        Previously seen        Diaphragm:              Appears normal
 Cerebellum:            Previously seen        Stomach:                Appears normal, left
                                                                       sided
 Posterior Fossa:       Previously seen        Abdomen:                Appears normal
 Nuchal Fold:           Not applicable (>20    Abdominal Wall:         Previously seen
                        wks GA)
 Face:                  Appears normal         Cord Vessels:           2 vessel cord,
                        (orbits and profile)
                                                                       absent Prisca Rose
 Lips:                  Appears normal         Kidneys:                Appear normal
 Palate:                Appears normal         Bladder:                Appears normal
 Thoracic:              Appears normal         Spine:                  Appears normal
 Heart:                 Appears normal         Upper Extremities:      Appears normal
                        (4CH, axis, and
                        situs)
 RVOT:                  Appears normal         Lower Extremities:      Appears normal

 Other:  Heels previously visualized. Nasal bone visualized. Open hands
         previously visualized. Technically difficult due to fetal position.
Doppler - Fetal Vessels

 Umbilical Artery
  S/D     %tile     RI              PI                     ADFV    RDFV
 2.31       14   0.57             0.81                        No      No

Cervix Uterus Adnexa

 Cervix
 Not visualized (advanced GA >01wks)
 Uterus
 No abnormality visualized.

 Left Ovary
 Within normal limits.

 Right Ovary
 Within normal limits.

 Cul De Sac
 No free fluid seen.

 Adnexa
 No abnormality visualized.
Comments

 This patient was seen for a follow-up growth scan due to a
 fetus with a two-vessel umbilical cord.  She denies any
 problems since her last exam.

 On today's exam, the overall EFW measured at the 9th
 percentile for her gestational age.  There was normal
 amniotic fluid noted.

 Doppler studies of the umbilical arteries performed today
 showed a normal S/D ratio of 2.31.

 The patient is scheduled to meet with our genetic counselor
 today to discuss the risk of recurrence of the [place]
 syndrome in her current fetus.

 A follow-up exam was scheduled in 2 weeks to assess the
 fetal growth and umbilical artery Doppler studies.

## 2020-11-27 ENCOUNTER — Encounter (HOSPITAL_COMMUNITY): Payer: Self-pay

## 2020-11-27 ENCOUNTER — Other Ambulatory Visit: Payer: Self-pay

## 2020-11-27 ENCOUNTER — Ambulatory Visit (HOSPITAL_COMMUNITY)
Admission: EM | Admit: 2020-11-27 | Discharge: 2020-11-27 | Disposition: A | Discharge status: Home / Self Care | Payer: Medicaid Other | Attending: Family Medicine | Admitting: Family Medicine

## 2020-11-27 DIAGNOSIS — G5701 Lesion of sciatic nerve, right lower limb: Secondary | ICD-10-CM | POA: Diagnosis not present

## 2020-11-27 MED ORDER — PREDNISONE 20 MG PO TABS: 40.0000 mg | ORAL_TABLET | Freq: Every day | ORAL | 0 refills | Status: DC

## 2020-11-27 MED ORDER — CYCLOBENZAPRINE HCL 5 MG PO TABS: 5.0000 mg | ORAL_TABLET | Freq: Three times a day (TID) | ORAL | 0 refills | Status: AC | PRN

## 2020-11-27 NOTE — ED Triage Notes (Signed)
Pt presents with  right lower back pain x 1 week.   States she was at work when the pain started.

## 2020-11-27 NOTE — ED Provider Notes (Signed)
[facility]    CSN: [id number] Arrival date & time: 11/27/20  1552      History   Chief Complaint Chief Complaint  Patient presents with   Back Pain    HPI Sylvia Chambers is a 25 y.o. female.   Patient here today with acute on chronic low back pain radiating to right buttock and down posterior right leg, current flare lasting 1 week.  She states that she began having back problems during her 2 pregnancies and has had intermittent issues since.  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  Tylenol and ibuprofen providing temporary for relief.  She denies any back trauma's, leg weakness, numbness, tingling, bowel or bladder incontinence, saddle paresthesias.   Past Medical History:  Diagnosis Date   Asthma    Infection    UTI   Pregnancy induced hypertension     Patient Active Problem List   Diagnosis Date Noted   Gestational hypertension 04/23/2019   Normal postpartum course 04/23/2019   IUGR (intrauterine growth restriction) affecting care of mother, third trimester, not applicable or unspecified fetus 04/21/2019   Encounter for induction of labor 04/21/2019   Preeclampsia 10/16/2017    Past Surgical History:  Procedure Laterality Date   EYE SURGERY      OB History     Gravida  2   Para  2   Term  2   Preterm      AB      Living  2      SAB      IAB      Ectopic      Multiple  0   Live Births  2            Home Medications    Prior to Admission medications   Medication Sig Start Date End Date Taking? Authorizing Provider  cyclobenzaprine (FLEXERIL) 5 MG tablet Take 1 tablet (5 mg total) by mouth 3 (three) times daily as needed for muscle spasms. Do not drink alcohol or drive while taking this medication.  May cause drowsiness. 11/27/20  Yes Particia Nearing, PA-C  predniSONE (DELTASONE) 20 MG tablet Take 2 tablets (40 mg total)  by mouth daily with breakfast. 11/27/20  Yes Particia Nearing, PA-C  NIFEdipine (ADALAT CC) 30 MG 24 hr tablet Take 1 tablet (30 mg total) by mouth daily. 04/23/19   Dale Ewing, FNP  Prenatal Vit-Fe Fumarate-FA (PRENATAL COMPLETE) 14-0.4 MG TABS Take 1 tablet by mouth daily. Patient not taking: Reported on 01/22/2019 09/10/18   Dartha Lodge, PA-C    Family History Family History  Problem Relation Age of Onset   Hypertension Mother    Asthma Mother    Healthy Father     Social History Social History   Tobacco Use   Smoking status: Former    Pack years: 0.00    Types: Cigars    Quit date: 11/22/2018    Years since quitting: 2.0   Smokeless tobacco: Never   Tobacco comments:    black and mild 2/day  Vaping Use   Vaping Use: Never used  Substance Use Topics   Alcohol use: No   Drug use: Never     Allergies   Patient has no known allergies.   Review of Systems Review of Systems Per HPI  Physical Exam Triage Vital Signs ED Triage Vitals  Enc  Vitals Group     BP 11/27/20 1630 121/67     Pulse Rate 11/27/20 1630 93     Resp 11/27/20 1630 18     Temp 11/27/20 1630 98.6 F (37 C)     Temp Source 11/27/20 1630 Oral     SpO2 11/27/20 1630 100 %     Weight --      Height --      Head Circumference --      Peak Flow --      Pain Score 11/27/20 1627 8     Pain Loc --      Pain Edu? --      Excl. in GC? --    No data found.  Updated Vital Signs BP 121/67 (BP Location: Right Arm)   Pulse 93   Temp 98.6 F (37 C) (Oral)   Resp 18   LMP 11/24/2020 (Exact Date)   SpO2 100%   Breastfeeding No   Visual Acuity Right Eye Distance:   Left Eye Distance:   Bilateral Distance:    Right Eye Near:   Left Eye Near:    Bilateral Near:     Physical Exam Vitals and nursing note reviewed.  Constitutional:      Appearance: Normal appearance. She is not ill-appearing.  HENT:     Head: Atraumatic.     Mouth/Throat:     Mouth: Mucous membranes are moist.   Eyes:     Extraocular Movements: Extraocular movements intact.     Conjunctiva/sclera: Conjunctivae normal.  Cardiovascular:     Rate and Rhythm: Normal rate and regular rhythm.     Heart sounds: Normal heart sounds.  Pulmonary:     Effort: Pulmonary effort is normal.     Breath sounds: Normal breath sounds.  Abdominal:     General: Bowel sounds are normal. There is no distension.     Palpations: Abdomen is soft.     Tenderness: There is no abdominal tenderness.  Musculoskeletal:        General: Tenderness present. No swelling or signs of injury. Normal range of motion.     Cervical back: Normal range of motion and neck supple.     Comments: Right lateral lumbar musculature tender to palpation extending down to right lateral and posterior gluteal muscles.  Negative straight leg raise bilateral lower extremities.  Normal gait  Skin:    General: Skin is warm and dry.     Findings: No erythema.  Neurological:     Mental Status: She is alert and oriented to person, place, and time.     Comments: Bilateral lower extremities neurovascularly intact  Psychiatric:        Mood and Affect: Mood normal.        Thought Content: Thought content normal.        Judgment: Judgment normal.   UC Treatments / Results  Labs (all labs ordered are listed, but only abnormal results are displayed) Labs Reviewed - No data to display  EKG  Radiology No results found.  Procedures Procedures (including critical care time)  Medications Ordered in UC Medications - No data to display  Initial Impression / Assessment and Plan / UC Course  I have reviewed the triage vital signs and the nursing notes.  Pertinent labs & imaging results that were available during my care of the patient were reviewed by me and considered in my medical decision making (see chart for details).     Consistent with piriformis syndrome causing  some sciatica issues.  Treat with prednisone, Flexeril, stretches, Epsom salt  soaks, rest.  Work note given.  Follow-up with sports med if recurring or not resolving.  Final Clinical Impressions(s) / UC Diagnoses   Final diagnoses:  Piriformis syndrome of right side   Discharge Instructions   None    ED Prescriptions     Medication Sig Dispense Auth. Provider   predniSONE (DELTASONE) 20 MG tablet Take 2 tablets (40 mg total) by mouth daily with breakfast. 10 tablet Particia Nearing, PA-C   cyclobenzaprine (FLEXERIL) 5 MG tablet Take 1 tablet (5 mg total) by mouth 3 (three) times daily as needed for muscle spasms. Do not drink alcohol or drive while taking this medication.  May cause drowsiness. 15 tablet Particia Nearing, [place]      PDMP not reviewed this encounter.   Particia Nearing, [place] 11/27/20 1732

## 2020-12-14 ENCOUNTER — Emergency Department (HOSPITAL_COMMUNITY)
Admission: EM | Admit: 2020-12-14 | Discharge: 2020-12-14 | Disposition: A | Discharge status: Home / Self Care | Payer: Medicaid Other | Attending: Emergency Medicine | Admitting: Emergency Medicine

## 2020-12-14 ENCOUNTER — Emergency Department (HOSPITAL_COMMUNITY): Payer: Medicaid Other

## 2020-12-14 ENCOUNTER — Other Ambulatory Visit: Payer: Self-pay

## 2020-12-14 DIAGNOSIS — M25571 Pain in right ankle and joints of right foot: Secondary | ICD-10-CM | POA: Diagnosis not present

## 2020-12-14 DIAGNOSIS — Y92410 Unspecified street and highway as the place of occurrence of the external cause: Secondary | ICD-10-CM | POA: Diagnosis not present

## 2020-12-14 DIAGNOSIS — J45909 Unspecified asthma, uncomplicated: Secondary | ICD-10-CM | POA: Diagnosis not present

## 2020-12-14 DIAGNOSIS — M545 Low back pain, unspecified: Secondary | ICD-10-CM | POA: Insufficient documentation

## 2020-12-14 DIAGNOSIS — S8011XA Contusion of right lower leg, initial encounter: Secondary | ICD-10-CM | POA: Diagnosis not present

## 2020-12-14 DIAGNOSIS — M25551 Pain in right hip: Secondary | ICD-10-CM | POA: Insufficient documentation

## 2020-12-14 DIAGNOSIS — R202 Paresthesia of skin: Secondary | ICD-10-CM | POA: Diagnosis not present

## 2020-12-14 DIAGNOSIS — Z87891 Personal history of nicotine dependence: Secondary | ICD-10-CM | POA: Insufficient documentation

## 2020-12-14 DIAGNOSIS — S8991XA Unspecified injury of right lower leg, initial encounter: Secondary | ICD-10-CM | POA: Diagnosis present

## 2020-12-14 DIAGNOSIS — O1490 Unspecified pre-eclampsia, unspecified trimester: Secondary | ICD-10-CM | POA: Diagnosis not present

## 2020-12-14 LAB — I-STAT BETA HCG BLOOD, ED (MC, WL, AP ONLY): I-stat hCG, quantitative: <5 m[IU]/mL (ref <5)

## 2020-12-14 MED ORDER — METHOCARBAMOL 500 MG PO TABS: 500.0000 mg | ORAL_TABLET | Freq: Two times a day (BID) | ORAL | 0 refills | Status: AC

## 2020-12-14 MED ORDER — OXYCODONE-ACETAMINOPHEN 5-325 MG PO TABS
1.0000 | ORAL_TABLET | Freq: Once | ORAL | Status: AC
  Administered 2020-12-14: 1 via ORAL
  Filled 2020-12-14: qty 1

## 2020-12-14 MED ORDER — METHOCARBAMOL 500 MG PO TABS
500.0000 mg | ORAL_TABLET | Freq: Once | ORAL | Status: AC
  Administered 2020-12-14: 500 mg via ORAL
  Filled 2020-12-14: qty 1

## 2020-12-14 MED ORDER — ACETAMINOPHEN 325 MG PO TABS
650.0000 mg | ORAL_TABLET | Freq: Once | ORAL | Status: AC
  Administered 2020-12-14: 650 mg via ORAL
  Filled 2020-12-14: qty 2

## 2020-12-14 MED ORDER — IBUPROFEN 400 MG PO TABS
600.0000 mg | ORAL_TABLET | Freq: Once | ORAL | Status: AC
  Administered 2020-12-14: 600 mg via ORAL
  Filled 2020-12-14: qty 1

## 2020-12-14 NOTE — ED Notes (Signed)
ED Provider at bedside. 
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
## 2020-12-14 NOTE — ED Notes (Signed)
Patient transported to X-ray 

## 2021-04-11 LAB — OB RESULTS CONSOLE HEPATITIS B SURFACE ANTIGEN: Hepatitis B Surface Ag: NEGATIVE

## 2021-04-11 LAB — OB RESULTS CONSOLE RUBELLA ANTIBODY, IGM: Rubella: IMMUNE

## 2021-04-30 ENCOUNTER — Ambulatory Visit
Admission: EM | Admit: 2021-04-30 | Discharge: 2021-04-30 | Discharge status: Against Medical Advice | Payer: Medicaid Other

## 2021-06-03 NOTE — L&D Delivery Note (Signed)
OB/GYN Faculty Practice Delivery Note ? ?Called to delivery for Dr. Alwyn Pea as Dr. Alwyn Pea in the [place] and patient feeling pushy and is a multiparous patient.  ? ?Sylvia Chambers is a 26 y.o. [id number] s/p SVD at [redacted]w[redacted]d. She was admitted for IOL for preE with severe features ? ?ROM: 4h 46m with clear fluid ?GBS Status: negative ?Maximum Maternal Temperature: 98.3 ? ? ?Delivery Date/Time: [id number] on 4/24 ?Delivery: Called to room and patient was complete and pushing. Head delivered LOA. No nuchal cord present. Shoulder and body delivered in usual fashion. Infant with spontaneous cry, placed on mother's abdomen, dried and stimulated. Cord clamped x 2 after 1-minute delay, and cut by friend of patient. Cord found to have a true knot. Cord blood drawn. Placenta delivered spontaneously with gentle cord traction. Fundus firm with massage and Pitocin. However consistent trickling noted and lower segment manual sweep done. Also patient was given TXA. After that and continued massage trickling stopped. Labia, perineum, vagina, and cervix inspected and found to be intact. ? ?Placenta: intact, to L&D ?Complications: none ?Lacerations: none ?EBL: 300 ?Analgesia: epidural ? ?Infant: female  APGARs 9,9  weight pending ? ?Renard Matter, MD, MPH ?OB Fellow, Faculty Practice ?[facility] ? ?

## 2021-06-28 LAB — OB RESULTS CONSOLE HIV ANTIBODY (ROUTINE TESTING): HIV: NONREACTIVE

## 2021-06-29 ENCOUNTER — Other Ambulatory Visit: Payer: Self-pay | Admitting: Obstetrics and Gynecology

## 2021-06-29 ENCOUNTER — Other Ambulatory Visit: Payer: Self-pay

## 2021-06-29 DIAGNOSIS — Z363 Encounter for antenatal screening for malformations: Secondary | ICD-10-CM

## 2021-07-09 ENCOUNTER — Encounter: Payer: Self-pay | Admitting: *Deleted

## 2021-07-13 ENCOUNTER — Other Ambulatory Visit: Payer: Self-pay | Admitting: *Deleted

## 2021-07-13 ENCOUNTER — Ambulatory Visit: Payer: Medicaid Other | Attending: Obstetrics and Gynecology

## 2021-07-13 ENCOUNTER — Other Ambulatory Visit: Payer: Self-pay

## 2021-07-13 ENCOUNTER — Encounter: Payer: Self-pay | Admitting: *Deleted

## 2021-07-13 ENCOUNTER — Ambulatory Visit: Payer: Medicaid Other | Attending: Obstetrics | Admitting: Obstetrics

## 2021-07-13 ENCOUNTER — Ambulatory Visit: Payer: Medicaid Other | Admitting: *Deleted

## 2021-07-13 VITALS — BP 131/79 | HR 106

## 2021-07-13 DIAGNOSIS — Z362 Encounter for other antenatal screening follow-up: Secondary | ICD-10-CM

## 2021-07-13 DIAGNOSIS — Z3A28 28 weeks gestation of pregnancy: Secondary | ICD-10-CM

## 2021-07-13 DIAGNOSIS — O09299 Supervision of pregnancy with other poor reproductive or obstetric history, unspecified trimester: Secondary | ICD-10-CM | POA: Diagnosis present

## 2021-07-13 DIAGNOSIS — O99213 Obesity complicating pregnancy, third trimester: Secondary | ICD-10-CM | POA: Diagnosis not present

## 2021-07-13 DIAGNOSIS — Z363 Encounter for antenatal screening for malformations: Secondary | ICD-10-CM | POA: Diagnosis present

## 2021-07-13 DIAGNOSIS — O09293 Supervision of pregnancy with other poor reproductive or obstetric history, third trimester: Secondary | ICD-10-CM | POA: Diagnosis not present

## 2021-07-13 NOTE — Progress Notes (Signed)
MFM Note  Sylvia Chambers was seen for a detailed fetal anatomy scan due to maternal obesity and a prior child who was born with the [place] syndrome.  The patient met with our genetic counselor during her last pregnancy and was advised that the recurrence of [place] syndrome in a subsequent pregnancy is unlikely, but could be as high as 50%.  There is no genetic testing available for [place] syndrome.  She denies any other significant past medical history and denies any problems in her current pregnancy.    She has declined all screening tests for fetal aneuploidy in her current pregnancy.  She was informed that the fetal growth and amniotic fluid level were appropriate for her gestational age.   There were no obvious fetal anomalies noted on today's ultrasound exam.  However, today's exam was limited due to the maternal body habitus and the fetal position.  The patient understands that it may not always be possible to diagnose [place] syndrome based on prenatal ultrasound exams.  The patient was informed that anomalies may be missed due to technical limitations. If the fetus is in a suboptimal position or maternal habitus is increased, visualization of the fetus in the maternal uterus may be impaired.  The patient was offered and declined a meeting with our genetic counselor today.  Due to maternal obesity, we will continue to follow her with growth ultrasounds throughout her pregnancy.  A follow-up exam was scheduled in 4 weeks to assess the fetal growth and to complete the views of the fetal anatomy.    The patient stated that all of her questions have been answered.  A total of 30 minutes was spent counseling and coordinating the care for this patient.  Greater than 50% of the time was spent in direct face-to-face contact.

## 2021-08-10 ENCOUNTER — Other Ambulatory Visit: Payer: Self-pay

## 2021-08-10 ENCOUNTER — Ambulatory Visit: Payer: Medicaid Other | Attending: Obstetrics

## 2021-08-10 ENCOUNTER — Ambulatory Visit: Payer: Medicaid Other | Admitting: *Deleted

## 2021-08-10 DIAGNOSIS — O99213 Obesity complicating pregnancy, third trimester: Secondary | ICD-10-CM | POA: Diagnosis not present

## 2021-08-10 DIAGNOSIS — O09293 Supervision of pregnancy with other poor reproductive or obstetric history, third trimester: Secondary | ICD-10-CM | POA: Diagnosis not present

## 2021-08-10 DIAGNOSIS — Z362 Encounter for other antenatal screening follow-up: Secondary | ICD-10-CM

## 2021-08-10 DIAGNOSIS — E669 Obesity, unspecified: Secondary | ICD-10-CM

## 2021-08-10 DIAGNOSIS — Z3A32 32 weeks gestation of pregnancy: Secondary | ICD-10-CM

## 2021-09-05 LAB — OB RESULTS CONSOLE GBS: GBS: NEGATIVE

## 2021-09-05 LAB — OB RESULTS CONSOLE GC/CHLAMYDIA
Chlamydia: POSITIVE
Gonorrhea: NEGATIVE

## 2021-09-23 ENCOUNTER — Encounter (HOSPITAL_COMMUNITY): Payer: Self-pay | Admitting: Obstetrics & Gynecology

## 2021-09-23 ENCOUNTER — Other Ambulatory Visit: Payer: Self-pay

## 2021-09-23 ENCOUNTER — Inpatient Hospital Stay (HOSPITAL_COMMUNITY): Payer: Medicaid Other | Admitting: Anesthesiology

## 2021-09-23 ENCOUNTER — Inpatient Hospital Stay (HOSPITAL_COMMUNITY)
Admission: AD | Admit: 2021-09-23 | Discharge: 2021-09-25 | DRG: 807 | Disposition: A | Discharge status: Home / Self Care | Payer: Medicaid Other | Attending: Obstetrics & Gynecology | Admitting: Obstetrics & Gynecology

## 2021-09-23 DIAGNOSIS — Z3A38 38 weeks gestation of pregnancy: Secondary | ICD-10-CM

## 2021-09-23 DIAGNOSIS — O169 Unspecified maternal hypertension, unspecified trimester: Secondary | ICD-10-CM | POA: Diagnosis present

## 2021-09-23 DIAGNOSIS — O1414 Severe pre-eclampsia complicating childbirth: Principal | ICD-10-CM | POA: Diagnosis present

## 2021-09-23 DIAGNOSIS — O163 Unspecified maternal hypertension, third trimester: Secondary | ICD-10-CM | POA: Diagnosis not present

## 2021-09-23 DIAGNOSIS — Z87891 Personal history of nicotine dependence: Secondary | ICD-10-CM

## 2021-09-23 DIAGNOSIS — Z3689 Encounter for other specified antenatal screening: Secondary | ICD-10-CM

## 2021-09-23 DIAGNOSIS — O1493 Unspecified pre-eclampsia, third trimester: Secondary | ICD-10-CM | POA: Diagnosis present

## 2021-09-23 DIAGNOSIS — O99214 Obesity complicating childbirth: Secondary | ICD-10-CM | POA: Diagnosis present

## 2021-09-23 LAB — CBC
HCT: 30.8 % — ABNORMAL LOW (ref 36.0–46.0)
Hemoglobin: 10.1 g/dL — ABNORMAL LOW (ref 12.0–15.0)
MCH: 28.2 pg (ref 26.0–34.0)
MCHC: 32.8 g/dL (ref 30.0–36.0)
MCV: 86 fL (ref 80.0–100.0)
Platelets: 245 10*3/uL (ref 150–400)
RBC: 3.58 MIL/uL — ABNORMAL LOW (ref 3.87–5.11)
RDW: 12.1 % (ref 11.5–15.5)
WBC: 6.3 10*3/uL (ref 4.0–10.5)
nRBC: 0 % (ref 0.0–0.2)

## 2021-09-23 LAB — URINALYSIS, ROUTINE W REFLEX MICROSCOPIC
Bilirubin Urine: NEGATIVE
Glucose, UA: NEGATIVE mg/dL
Hgb urine dipstick: NEGATIVE
Ketones, ur: NEGATIVE mg/dL
Nitrite: NEGATIVE
Protein, ur: NEGATIVE mg/dL
Specific Gravity, Urine: 1.014 (ref 1.005–1.030)
pH: 6 (ref 5.0–8.0)

## 2021-09-23 LAB — COMPREHENSIVE METABOLIC PANEL
ALT: 6 U/L (ref 0–44)
AST: 12 U/L — ABNORMAL LOW (ref 15–41)
Albumin: 2.8 g/dL — ABNORMAL LOW (ref 3.5–5.0)
Alkaline Phosphatase: 85 U/L (ref 38–126)
Anion gap: 8 (ref 5–15)
BUN: <5 mg/dL — ABNORMAL LOW (ref 6–20)
CO2: 23 mmol/L (ref 22–32)
Calcium: 8.8 mg/dL — ABNORMAL LOW (ref 8.9–10.3)
Chloride: 106 mmol/L (ref 98–111)
Creatinine, Ser: 0.65 mg/dL (ref 0.44–1.00)
GFR, Estimated: >60 mL/min (ref >60)
Glucose, Bld: 87 mg/dL (ref 70–99)
Potassium: 3 mmol/L — ABNORMAL LOW (ref 3.5–5.1)
Sodium: 137 mmol/L (ref 135–145)
Total Bilirubin: 0.4 mg/dL (ref 0.3–1.2)
Total Protein: 6.1 g/dL — ABNORMAL LOW (ref 6.5–8.1)

## 2021-09-23 LAB — TYPE AND SCREEN
ABO/RH(D): B POS
Antibody Screen: NEGATIVE

## 2021-09-23 MED ORDER — OXYCODONE-ACETAMINOPHEN 5-325 MG PO TABS: 2.0000 | ORAL_TABLET | ORAL | Status: DC | PRN

## 2021-09-23 MED ORDER — OXYTOCIN-SODIUM CHLORIDE 30-0.9 UT/500ML-% IV SOLN
1.0000 m[IU]/min | INTRAVENOUS | Status: DC
  Administered 2021-09-23: 2 m[IU]/min via INTRAVENOUS
  Filled 2021-09-23: qty 500

## 2021-09-23 MED ORDER — MAGNESIUM SULFATE 40 GM/1000ML IV SOLN
2.0000 g/h | INTRAVENOUS | Status: AC
  Administered 2021-09-23 – 2021-09-24 (×2): 2 g/h via INTRAVENOUS
  Filled 2021-09-23 (×2): qty 1000

## 2021-09-23 MED ORDER — LACTATED RINGERS IV SOLN
500.0000 mL | Freq: Once | INTRAVENOUS | Status: AC
  Administered 2021-09-23: 250 mL via INTRAVENOUS

## 2021-09-23 MED ORDER — SOD CITRATE-CITRIC ACID 500-334 MG/5ML PO SOLN: 30.0000 mL | ORAL | Status: DC | PRN

## 2021-09-23 MED ORDER — LACTATED RINGERS IV SOLN: 500.0000 mL | INTRAVENOUS | Status: DC | PRN

## 2021-09-23 MED ORDER — HYDRALAZINE HCL 20 MG/ML IJ SOLN
10.0000 mg | INTRAMUSCULAR | Status: DC | PRN
Start: 2021-09-23 — End: 2021-09-25

## 2021-09-23 MED ORDER — LIDOCAINE HCL (PF) 1 % IJ SOLN: 30.0000 mL | INTRAMUSCULAR | Status: DC | PRN

## 2021-09-23 MED ORDER — FENTANYL-BUPIVACAINE-NACL 0.5-0.125-0.9 MG/250ML-% EP SOLN
EPIDURAL | Status: DC | PRN
  Administered 2021-09-23: 12 mL/h via EPIDURAL

## 2021-09-23 MED ORDER — PHENYLEPHRINE 80 MCG/ML (10ML) SYRINGE FOR IV PUSH (FOR BLOOD PRESSURE SUPPORT)
80.0000 ug | PREFILLED_SYRINGE | INTRAVENOUS | Status: DC | PRN
  Filled 2021-09-23: qty 10

## 2021-09-23 MED ORDER — LABETALOL HCL 5 MG/ML IV SOLN
40.0000 mg | INTRAVENOUS | Status: DC | PRN
Start: 2021-09-23 — End: 2021-09-25

## 2021-09-23 MED ORDER — LACTATED RINGERS IV SOLN: INTRAVENOUS | Status: DC

## 2021-09-23 MED ORDER — MAGNESIUM SULFATE BOLUS VIA INFUSION: 4.0000 g | Freq: Once | INTRAVENOUS | Status: DC

## 2021-09-23 MED ORDER — LIDOCAINE HCL (PF) 1 % IJ SOLN
INTRAMUSCULAR | Status: DC | PRN
  Administered 2021-09-23: 2 mL via EPIDURAL
  Administered 2021-09-23: 10 mL via EPIDURAL

## 2021-09-23 MED ORDER — DIPHENHYDRAMINE HCL 50 MG/ML IJ SOLN: 12.5000 mg | INTRAMUSCULAR | Status: DC | PRN

## 2021-09-23 MED ORDER — EPHEDRINE 5 MG/ML INJ: 10.0000 mg | INTRAVENOUS | Status: DC | PRN

## 2021-09-23 MED ORDER — MAGNESIUM SULFATE 40 GM/1000ML IV SOLN: 2.0000 g/h | INTRAVENOUS | Status: DC

## 2021-09-23 MED ORDER — ACETAMINOPHEN 325 MG PO TABS
650.0000 mg | ORAL_TABLET | ORAL | Status: DC | PRN
  Administered 2021-09-23 (×2): 650 mg via ORAL
  Filled 2021-09-23 (×2): qty 2

## 2021-09-23 MED ORDER — LABETALOL HCL 5 MG/ML IV SOLN: 80.0000 mg | INTRAVENOUS | Status: DC | PRN

## 2021-09-23 MED ORDER — LABETALOL HCL 5 MG/ML IV SOLN
20.0000 mg | INTRAVENOUS | Status: DC | PRN
Start: 2021-09-23 — End: 2021-09-25

## 2021-09-23 MED ORDER — OXYTOCIN BOLUS FROM INFUSION
333.0000 mL | Freq: Once | INTRAVENOUS | Status: AC
  Administered 2021-09-24: 333 mL via INTRAVENOUS

## 2021-09-23 MED ORDER — OXYTOCIN-SODIUM CHLORIDE 30-0.9 UT/500ML-% IV SOLN: 2.5000 [IU]/h | INTRAVENOUS | Status: DC

## 2021-09-23 MED ORDER — ONDANSETRON HCL 4 MG/2ML IJ SOLN
4.0000 mg | Freq: Four times a day (QID) | INTRAMUSCULAR | Status: DC | PRN
  Administered 2021-09-23: 4 mg via INTRAVENOUS
  Filled 2021-09-23: qty 2

## 2021-09-23 MED ORDER — FENTANYL-BUPIVACAINE-NACL 0.5-0.125-0.9 MG/250ML-% EP SOLN
12.0000 mL/h | EPIDURAL | Status: DC | PRN
  Filled 2021-09-23: qty 250

## 2021-09-23 MED ORDER — MAGNESIUM SULFATE BOLUS VIA INFUSION
4.0000 g | Freq: Once | INTRAVENOUS | Status: AC
  Administered 2021-09-23: 4 g via INTRAVENOUS
  Filled 2021-09-23: qty 1000

## 2021-09-23 MED ORDER — OXYCODONE-ACETAMINOPHEN 5-325 MG PO TABS
1.0000 | ORAL_TABLET | ORAL | Status: DC | PRN
  Administered 2021-09-24 – 2021-09-25 (×4): 1 via ORAL
  Filled 2021-09-23 (×4): qty 1

## 2021-09-23 MED ORDER — TERBUTALINE SULFATE 1 MG/ML IJ SOLN: 0.2500 mg | Freq: Once | INTRAMUSCULAR | Status: DC | PRN

## 2021-09-23 NOTE — MAU Note (Addendum)
Patient arrived to MAU complaining of abd pain, pelvic pressure and back pain that started 4 days ago and has been getting worse.  Patient stated that she is unsure if she is having contractions.  ?Patient denies vaginal bleeding, and or leakage of fluid. ?+ FM reported ? ?Patient stated that her cervix was checked 4/20, and she was 2 cm dilated. ?

## 2021-09-23 NOTE — Anesthesia Preprocedure Evaluation (Signed)
Anesthesia Evaluation  ?Patient identified by MRN, date of birth, ID band ?Patient awake ? ? ? ?Reviewed: ?Allergy & Precautions, Patient's Chart, lab work & pertinent test results ? ?Airway ?Mallampati: II ? ?TM Distance: >3 FB ?Neck ROM: Full ? ? ? Dental ?no notable dental hx. ? ?  ?Pulmonary ?asthma , former smoker,  ?  ?Pulmonary exam normal ?breath sounds clear to auscultation ? ? ? ? ? ? Cardiovascular ?hypertension (PIH, now preE with SF), Pt. on medications ?Normal cardiovascular exam ?Rhythm:Regular Rate:Normal ? ? ?  ?Neuro/Psych ?negative neurological ROS ? negative psych ROS  ? GI/Hepatic ?negative GI ROS, Neg liver ROS,   ?Endo/Other  ?BMI 36 ? Renal/GU ?negative Renal ROS  ?negative genitourinary ?  ?Musculoskeletal ?negative musculoskeletal ROS ?(+)  ? Abdominal ?  ?Peds ?negative pediatric ROS ?(+)  Hematology ?negative hematology ROS ?(+) Hb 10.1 plt 245   ?Anesthesia Other Findings ? ? Reproductive/Obstetrics ?(+) Pregnancy ? ?  ? ? ? ? ? ? ? ? ? ? ? ? ? ?  ?  ? ? ? ? ? ? ? ? ?Anesthesia Physical ?Anesthesia Plan ? ?ASA: 3 ? ?Anesthesia Plan: Epidural  ? ?Post-op Pain Management:   ? ?Induction:  ? ?PONV Risk Score and Plan: 2 ? ?Airway Management Planned: Natural Airway ? ?Additional Equipment: None ? ?Intra-op Plan:  ? ?Post-operative Plan:  ? ?Informed Consent: I have reviewed the patients History and Physical, chart, labs and discussed the procedure including the risks, benefits and alternatives for the proposed anesthesia with the patient or authorized representative who has indicated his/her understanding and acceptance.  ? ? ? ? ? ?Plan Discussed with:  ? ?Anesthesia Plan Comments:   ? ? ? ? ? ? ?Anesthesia Quick Evaluation ? ?

## 2021-09-23 NOTE — MAU Provider Note (Addendum)
?History  ?  ? ?CSN: [id number] ? ?Arrival date and time: 09/23/21 1339 ? ? Event Date/Time  ? First Provider Initiated Contact with Patient 09/23/21 1431   ?  ? ?Chief Complaint  ?Patient presents with  ? Contractions  ? Pelvic Pain  ? ?26 year old G3 P2-0-0-2 at 38.6 weeks presenting for labor check and found to have elevated BPs.  Reports mild left temporal headache today. Denies visual disturbances, RUQ pain, SOB, and CP.  Reports worsening abdominal, low back, and pelvic pain over the last 4 days, unsure if the symptoms are contractions.  Reports good fetal movement.  Denies VB and LOF. ? ? ? ?OB History   ? ? Gravida  ?3  ? Para  ?2  ? Term  ?2  ? Preterm  ?   ? AB  ?   ? Living  ?2  ?  ? ? SAB  ?   ? IAB  ?   ? Ectopic  ?   ? Multiple  ?0  ? Live Births  ?2  ?   ?  ?  ? ? ?Past Medical History:  ?Diagnosis Date  ? Asthma   ? Infection   ? UTI  ? Pregnancy induced hypertension   ? ? ?Past Surgical History:  ?Procedure Laterality Date  ? EYE SURGERY    ? ? ?Family History  ?Problem Relation Age of Onset  ? Hypertension Mother   ? Asthma Mother   ? Healthy Father   ? ? ?Social History  ? ?Tobacco Use  ? Smoking status: Former  ?  Types: Cigars  ?  Quit date: 11/22/2018  ?  Years since quitting: 2.8  ? Smokeless tobacco: Never  ? Tobacco comments:  ?  black and mild 2/day  ?Vaping Use  ? Vaping Use: Never used  ?Substance Use Topics  ? Alcohol use: No  ? Drug use: Never  ? ? ?Allergies: No Known Allergies ? ?Medications Prior to Admission  ?Medication Sig Dispense Refill Last Dose  ? cyclobenzaprine (FLEXERIL) 5 MG tablet Take 1 tablet (5 mg total) by mouth 3 (three) times daily as needed for muscle spasms. Do not drink alcohol or drive while taking this medication.  May cause drowsiness. (Patient not taking: Reported on 08/10/2021) 15 tablet 0   ? methocarbamol (ROBAXIN) 500 MG tablet Take 1 tablet (500 mg total) by mouth 2 (two) times daily. (Patient not taking: Reported on 07/13/2021) 20 tablet 0   ? NIFEdipine  (ADALAT CC) 30 MG 24 hr tablet Take 1 tablet (30 mg total) by mouth daily. (Patient not taking: Reported on 07/13/2021) 30 tablet 0   ? predniSONE (DELTASONE) 20 MG tablet Take 2 tablets (40 mg total) by mouth daily with breakfast. (Patient not taking: Reported on 07/13/2021) 10 tablet 0   ? Prenatal Vit-Fe Fumarate-FA (PRENATAL COMPLETE) 14-0.4 MG TABS Take 1 tablet by mouth daily. (Patient not taking: Reported on 01/22/2019) 30 each 0   ? ? ?Review of Systems  ?Eyes:  Negative for visual disturbance.  ?Respiratory:  Negative for shortness of breath.   ?Cardiovascular:  Negative for chest pain and leg swelling.  ?Gastrointestinal:  Negative for abdominal pain.  ?Genitourinary:  Negative for vaginal bleeding and vaginal discharge.  ?Neurological:  Positive for headaches.  ?Physical Exam  ? ?Blood pressure (!) 145/98, pulse 80, temperature 97.6 ?F (36.4 ?C), temperature source Oral, resp. rate 12, height 5\' 3"  (1.6 m), weight 92.5 kg, last menstrual period 12/25/2020, SpO2 98 %. ?Patient Vitals  for the past 24 hrs: ? BP Temp Temp src Pulse Resp SpO2 Height Weight  ?09/23/21 1435 -- -- -- -- -- 98 % -- --  ?09/23/21 1430 (!) 145/98 -- -- 80 -- 98 % -- --  ?09/23/21 1425 -- -- -- -- -- 98 % -- --  ?09/23/21 1420 -- -- -- -- -- 98 % -- --  ?09/23/21 1417 (!) 145/91 -- -- 84 -- -- -- --  ?09/23/21 1415 -- -- -- -- -- 98 % -- --  ?09/23/21 1410 -- -- -- -- -- 97 % -- --  ?09/23/21 1405 -- -- -- -- -- 97 % -- --  ?09/23/21 1404 (!) 141/96 97.6 ?F (36.4 ?C) Oral 96 12 -- 5\' 3"  (1.6 m) 92.5 kg  ? ? ?Physical Exam ?Vitals and nursing note reviewed.  ?Constitutional:   ?   General: She is not in acute distress. ?   Appearance: Normal appearance.  ?HENT:  ?   Head: Normocephalic and atraumatic.  ?Cardiovascular:  ?   Rate and Rhythm: Normal rate.  ?Pulmonary:  ?   Effort: Pulmonary effort is normal. No respiratory distress.  ?Genitourinary: ?   Comments: SVE: 2/40 per RN ?Musculoskeletal:     ?   General: Normal range of motion.   ?   Cervical back: Normal range of motion.  ?Skin: ?   General: Skin is warm and dry.  ?Neurological:  ?   General: No focal deficit present.  ?   Mental Status: She is alert and oriented to person, place, and time.  ?Psychiatric:     ?   Mood and Affect: Mood normal.     ?   Behavior: Behavior normal.  ?EFM: 135 mod bpm, + variability, + accels, no decels ?Toco: irreg ? ?Results for orders placed or performed during the hospital encounter of 09/23/21 (from the past 24 hour(s))  ?Urinalysis, Routine w reflex microscopic Urine, Clean Catch     Status: Abnormal  ? Collection Time: 09/23/21  2:01 PM  ?Result Value Ref Range  ? Color, Urine YELLOW YELLOW  ? APPearance CLOUDY (A) CLEAR  ? Specific Gravity, Urine 1.014 1.005 - 1.030  ? pH 6.0 5.0 - 8.0  ? Glucose, UA NEGATIVE NEGATIVE mg/dL  ? Hgb urine dipstick NEGATIVE NEGATIVE  ? Bilirubin Urine NEGATIVE NEGATIVE  ? Ketones, ur NEGATIVE NEGATIVE mg/dL  ? Protein, ur NEGATIVE NEGATIVE mg/dL  ? Nitrite NEGATIVE NEGATIVE  ? Leukocytes,Ua LARGE (A) NEGATIVE  ? RBC / HPF 0-5 0 - 5 RBC/hpf  ? WBC, UA 21-50 0 - 5 WBC/hpf  ? Bacteria, UA FEW (A) NONE SEEN  ? Squamous Epithelial / LPF 11-20 0 - 5  ? Mucus PRESENT   ? ?MAU Course  ?Procedures ? ?MDM ?Previous records reviewed.  History of PEC in prior 2 pregnancies, chlamydia and trichomonas this pregnancy, obesity, previous child with [place] anomaly, history of fetal growth restriction in previous pregnancy, abnormal Pap smear. ?Dr. Alwyn Pea notified of presentation, clinical findings and recommendation for admit.  Will place orders for MgSO4 at Dr. Tyler Aas request. ? ?Assessment and Plan  ? ?1. [redacted] weeks gestation of pregnancy   ?2. NST (non-stress test) reactive   ?3. Hypertension affecting pregnancy in third trimester   ? ?Admit to L&D ?PEC labs ?MgS04 ?Management per Dr. Alwyn Pea ? ?Julianne Handler, CNM ?09/23/2021, 2:58 PM  ?

## 2021-09-23 NOTE — Anesthesia Procedure Notes (Signed)
Epidural ?Patient location during procedure: OB ?Start time: 09/23/2021 8:21 PM ?End time: 09/23/2021 8:31 PM ? ?Staffing ?Anesthesiologist: Lannie Fields, DO ?Performed: anesthesiologist  ? ?Preanesthetic Checklist ?Completed: patient identified, IV checked, risks and benefits discussed, monitors and equipment checked, pre-op evaluation and timeout performed ? ?Epidural ?Patient position: sitting ?Prep: DuraPrep and site prepped and draped ?Patient monitoring: continuous pulse ox, blood pressure, heart rate and cardiac monitor ?Approach: midline ?Location: L3-L4 ?Injection technique: LOR air ? ?Needle:  ?Needle type: Tuohy  ?Needle gauge: 17 G ?Needle length: 9 cm ?Needle insertion depth: 8 cm ?Catheter type: closed end flexible ?Catheter size: 19 Gauge ?Catheter at skin depth: 13 cm ?Test dose: negative ? ?Assessment ?Sensory level: T8 ?Events: blood not aspirated, injection not painful, no injection resistance, no paresthesia and negative IV test ? ?Additional Notes ?Patient identified. Risks/Benefits/Options discussed with patient including but not limited to bleeding, infection, nerve damage, paralysis, failed block, incomplete pain control, headache, blood pressure changes, nausea, vomiting, reactions to medication both or allergic, itching and postpartum back pain. Confirmed with bedside nurse the patient's most recent platelet count. Confirmed with patient that they are not currently taking any anticoagulation, have any bleeding history or any family history of bleeding disorders. Patient expressed understanding and wished to proceed. All questions were answered. Sterile technique was used throughout the entire procedure. Please see nursing notes for vital signs. Test dose was given through epidural catheter and negative prior to continuing to dose epidural or start infusion. Warning signs of high block given to the patient including shortness of breath, tingling/numbness in hands, complete motor  block, or any concerning symptoms with instructions to call for help. Patient was given instructions on fall risk and not to get out of bed. All questions and concerns addressed with instructions to call with any issues or inadequate analgesia.  Reason for block:procedure for pain ? ? ? ?

## 2021-09-23 NOTE — H&P (Signed)
OB ADMISSION HISTORY & PHYSICAL ? ?Admission Date: 09/23/2021  1:39 PM  ?Admit Diagnosis: preeclampsia with severe features ? ?Sylvia Chambers is a 26 y.o. female [id number] [redacted]w[redacted]d presented to [facility] for labor check and found to have elevated BPs.  Reports mild left temporal headache today. Denies visual disturbances, RUQ pain, SOB, and CP.  Reports worsening abdominal, low back, and pelvic pain over the last 4 days, unsure if the symptoms are contractions.  Reports good fetal movement.  Denies VB and LOF. ? ?History of current pregnancy: ?G3P2002   ?Prenatal Care with: [facility] ?Patient entered prenatal care at 15 wks.   ?EDC 10/01/21 by LMP7/25/23 and congruent w/ 14 wk U/S.   ?Anatomy scan:  22 wks, complete w/ anterior placenta.   ?Antenatal testing: for BMI started at 36 weeks ? ?Significant prenatal problems: ?History of preeclampsia previous pregnancy ?Maternal obesity affecting pregnancy ?Exposure to STIs during pregnancy - trichomoniasis and chlamydia -treated ?History of IUGR - previous pregnancy - normal growth current pregnancy ?Family history of congential disorder - son with [place] syndrome ? ?Prenatal Labs: ?ABO, Rh:  B pos ?Antibody:  Neg ?Rubella:    Immune ?RPR:         NR ?HBsAg:     Neg ?HIV:          Neg ?GTT:         PASS ?GBS:         Neg ?GC/CHL:   POS 09/03/21 ?Genetics:   Declined ?Vaccines: ?Tdap: declined ?Flu declined ?Covid: declined ? ?Prenatal Transfer Tool  ?Maternal Diabetes: No ?Genetic Screening: Declined ?Maternal Ultrasounds/Referrals: Normal ?Fetal Ultrasounds or other Referrals:  Referred to [facility]  ?Maternal Substance Abuse:  No ?Significant Maternal Medications:  Meds include: Other: PNV, vitamin D, azithromycin, Flagyl, cyclobenzparine ?Significant Maternal Lab Results:  Group B Strep negative ?Other Comments:  None ? ?OB History  ?Gravida Para Term Preterm AB Living  ?3 2 2     2   ?SAB IAB Ectopic Multiple Live Births  ?      0 2  ?  ?# Outcome Date GA Lbr Len/2nd Weight  Sex Delivery Anes PTL Lv  ?3 Current           ?2 Term 04/21/19 [redacted]w[redacted]d 01:04 / 00:02 2404 g M Vag-Spont EPI  LIV  ?1 Term 10/17/17 [redacted]w[redacted]d 06:13 / 00:30 3045 g M Vag-Spont EPI  LIV  ? ? ?Medical / Surgical History: ?Past medical history:  ?Past Medical History:  ?Diagnosis Date  ? Asthma   ? Infection   ? UTI  ? Pregnancy induced hypertension   ?  ?Past surgical history:  ?Past Surgical History:  ?Procedure Laterality Date  ? EYE SURGERY    ? ?Family History:  ?Family History  ?Problem Relation Age of Onset  ? Hypertension Mother   ? Asthma Mother   ? Healthy Father   ?  ?Social History:  reports that she quit smoking about 2 years ago. Her smoking use included cigars. She has never used smokeless tobacco. She reports that she does not drink alcohol and does not use drugs. ? ?Allergies: ?Patient has no known allergies. ?  ?Current Medications at time of admission:  ?Prior to Admission medications   ?Medication Sig Start Date End Date Taking? Authorizing Provider  ?cyclobenzaprine (FLEXERIL) 5 MG tablet Take 1 tablet (5 mg total) by mouth 3 (three) times daily as needed for muscle spasms. Do not drink alcohol or drive while taking this medication.  May cause  drowsiness. ?Patient not taking: Reported on 08/10/2021 11/27/20   Volney American, PA-C  ?methocarbamol (ROBAXIN) 500 MG tablet Take 1 tablet (500 mg total) by mouth 2 (two) times daily. ?Patient not taking: Reported on 07/13/2021 12/14/20   Delia Heady, PA-C  ?NIFEdipine (ADALAT CC) 30 MG 24 hr tablet Take 1 tablet (30 mg total) by mouth daily. ?Patient not taking: Reported on 07/13/2021 04/23/19   Noralyn Pick, [place]  ?predniSONE (DELTASONE) 20 MG tablet Take 2 tablets (40 mg total) by mouth daily with breakfast. ?Patient not taking: Reported on 07/13/2021 11/27/20   Volney American, PA-C  ?Prenatal Vit-Fe Fumarate-FA (PRENATAL COMPLETE) 14-0.4 MG TABS Take 1 tablet by mouth daily. ?Patient not taking: Reported on 01/22/2019 09/10/18   Jacqlyn Larsen, PA-C   ? ? ?Review of Systems: ?Constitutional: Negative   ?HENT: Negative   ?Eyes: Negative   ?Respiratory: Negative   ?Cardiovascular: Negative   ?Gastrointestinal: Negative  ?Genitourinary: neg for bloody show, neg for LOF   ?Musculoskeletal: Negative   ?Skin: Negative   ?Neurological: POS for HA ?Endo/Heme/Allergies: Negative   ?Psychiatric/Behavioral: Negative  ? ? ?Physical Exam: ?VS: Blood pressure (!) 145/98, pulse 80, temperature 97.6 ?F (36.4 ?C), temperature source Oral, resp. rate 12, height 5\' 3"  (1.6 m), weight 92.5 kg, last menstrual period 12/25/2020, SpO2 98 %. ?AAO x3, no signs of distress ?Cardiovascular: RRR ?Respiratory: Lung fields clear to ausculation ?GU/GI: Abdomen gravid, non-tender, non-distended, active FM, vertex, EFW 2800 grams per Leopold's ?Extremities: 1+ edema, negative for pain, tenderness, and cords ? ?Cervical exam:Dilation: 2 ?Effacement (%): 40 ?Exam by:: E.Edwards, RN ?FHR: baseline rate 140 / variability moderate / accelerations present / absent decelerations ?TOCO: irregular ctx ? ?  ? ? ?Assessment: ?26 y.o. [id number] [redacted]w[redacted]d admitted for induction of labor for new diagnosis of preeclampsia with severe features. ? ?First stage of labor ?FHR category 1 ?GBS neg ?Pain management plan: epidural ? ? ?Plan:  ?Admit to Labor and Delivery ?Routine admission orders ?Epidural on maternal demand ?IV magnesium sulfate for seizure prophylaxis ?Preelampsia labs ?Pitocin for induction of labor ?IV hydration ?Continuous monitoring ?Anticipate NSVD delivery ? ? ?Sanjuana Kava MD ?09/23/2021 3:19 PM  ?

## 2021-09-23 NOTE — Progress Notes (Signed)
MD LABOR PROGRESS NOTE ? ?Sylvia Chambers is a 26 y.o. G3P2002 at [redacted]w[redacted]d  admitted for induction of labor due to Pre-eclamptic toxemia of pregnancy.. ? ?Subjective:  ?Patient feeling more painful contractions ? ?Objective:  ?BP 138/88   Pulse 74   Temp 97.9 ?F (36.6 ?C) (Oral)   Resp 20   Ht 5\' 3"  (1.6 m)   Wt 92.5 kg   LMP 12/25/2020   SpO2 100%   BMI 36.14 kg/m?  ? ?FHT:  FHR: 120 bpm, variability: moderate,  accelerations:  Present,  decelerations:  Absent ?UC:   regular, every 3-5 minutes ?SVE:   Dilation: 4 ?Effacement (%): 70 ?Station: -1 ?Exam by:: Shajuan Musso ?AROM: clear fluid ? ?Pitocin @ 10 mu/min ? ?Labs: ?Lab Results  ?Component Value Date  ? WBC 6.3 09/23/2021  ? HGB 10.1 (L) 09/23/2021  ? HCT 30.8 (L) 09/23/2021  ? MCV 86.0 09/23/2021  ? PLT 245 09/23/2021  ? ? ?Assessment / Plan: ?26 y.o. 22 [redacted]w[redacted]d SIUP in early/active labor ?Induction of labor due to preeclampsia,  progressing well on pitocin ? ?Labor: Progressing on Pitocin, s/p AROM ?Fetal Wellbeing:  Category I ?Pain Control:  Labor support without medications ?Anticipated MOD:  NSVD ? ?Expectant management ? ? ?[redacted]w[redacted]d, MD  ?09/23/2021 ?8:27 PM ?  ?

## 2021-09-24 ENCOUNTER — Encounter (HOSPITAL_COMMUNITY): Payer: Self-pay | Admitting: Obstetrics & Gynecology

## 2021-09-24 DIAGNOSIS — O1414 Severe pre-eclampsia complicating childbirth: Secondary | ICD-10-CM | POA: Diagnosis not present

## 2021-09-24 DIAGNOSIS — Z3A38 38 weeks gestation of pregnancy: Secondary | ICD-10-CM | POA: Diagnosis not present

## 2021-09-24 LAB — COMPREHENSIVE METABOLIC PANEL
ALT: 7 U/L (ref 0–44)
AST: 16 U/L (ref 15–41)
Albumin: 2.9 g/dL — ABNORMAL LOW (ref 3.5–5.0)
Alkaline Phosphatase: 94 U/L (ref 38–126)
Anion gap: 5 (ref 5–15)
BUN: <5 mg/dL — ABNORMAL LOW (ref 6–20)
CO2: 24 mmol/L (ref 22–32)
Calcium: 7.4 mg/dL — ABNORMAL LOW (ref 8.9–10.3)
Chloride: 106 mmol/L (ref 98–111)
Creatinine, Ser: 0.59 mg/dL (ref 0.44–1.00)
GFR, Estimated: >60 mL/min (ref >60)
Glucose, Bld: 86 mg/dL (ref 70–99)
Potassium: 2.8 mmol/L — ABNORMAL LOW (ref 3.5–5.1)
Sodium: 135 mmol/L (ref 135–145)
Total Bilirubin: 0.4 mg/dL (ref 0.3–1.2)
Total Protein: 6.2 g/dL — ABNORMAL LOW (ref 6.5–8.1)

## 2021-09-24 LAB — CBC
HCT: 30.5 % — ABNORMAL LOW (ref 36.0–46.0)
HCT: 29.3 % — ABNORMAL LOW (ref 36.0–46.0)
Hemoglobin: 10.1 g/dL — ABNORMAL LOW (ref 12.0–15.0)
Hemoglobin: 9.9 g/dL — ABNORMAL LOW (ref 12.0–15.0)
MCH: 28.4 pg (ref 26.0–34.0)
MCH: 28.7 pg (ref 26.0–34.0)
MCHC: 33.1 g/dL (ref 30.0–36.0)
MCHC: 33.8 g/dL (ref 30.0–36.0)
MCV: 85.7 fL (ref 80.0–100.0)
MCV: 84.9 fL (ref 80.0–100.0)
Platelets: 233 10*3/uL (ref 150–400)
Platelets: 225 10*3/uL (ref 150–400)
RBC: 3.56 MIL/uL — ABNORMAL LOW (ref 3.87–5.11)
RBC: 3.45 MIL/uL — ABNORMAL LOW (ref 3.87–5.11)
RDW: 12 % (ref 11.5–15.5)
RDW: 12.1 % (ref 11.5–15.5)
WBC: 8.6 10*3/uL (ref 4.0–10.5)
WBC: 8.2 10*3/uL (ref 4.0–10.5)
nRBC: 0 % (ref 0.0–0.2)
nRBC: 0 % (ref 0.0–0.2)

## 2021-09-24 LAB — RPR: RPR Ser Ql: NONREACTIVE

## 2021-09-24 MED ORDER — POTASSIUM CHLORIDE CRYS ER 20 MEQ PO TBCR
40.0000 meq | EXTENDED_RELEASE_TABLET | Freq: Two times a day (BID) | ORAL | Status: DC
  Administered 2021-09-24 – 2021-09-25 (×3): 40 meq via ORAL
  Filled 2021-09-24 (×3): qty 2

## 2021-09-24 MED ORDER — WITCH HAZEL-GLYCERIN EX PADS: 1.0000 "application " | MEDICATED_PAD | CUTANEOUS | Status: DC | PRN

## 2021-09-24 MED ORDER — BENZOCAINE-MENTHOL 20-0.5 % EX AERO: 1.0000 "application " | INHALATION_SPRAY | CUTANEOUS | Status: DC | PRN

## 2021-09-24 MED ORDER — NIFEDIPINE ER OSMOTIC RELEASE 30 MG PO TB24
30.0000 mg | ORAL_TABLET | Freq: Every day | ORAL | Status: DC
  Administered 2021-09-25: 30 mg via ORAL
  Filled 2021-09-24: qty 1

## 2021-09-24 MED ORDER — COCONUT OIL OIL: 1.0000 "application " | TOPICAL_OIL | Status: DC | PRN

## 2021-09-24 MED ORDER — LACTATED RINGERS IV SOLN: INTRAVENOUS | Status: DC

## 2021-09-24 MED ORDER — CYCLOBENZAPRINE HCL 10 MG PO TABS: 5.0000 mg | ORAL_TABLET | Freq: Three times a day (TID) | ORAL | Status: DC | PRN

## 2021-09-24 MED ORDER — SIMETHICONE 80 MG PO CHEW: 80.0000 mg | CHEWABLE_TABLET | ORAL | Status: DC | PRN

## 2021-09-24 MED ORDER — SENNOSIDES-DOCUSATE SODIUM 8.6-50 MG PO TABS
2.0000 | ORAL_TABLET | Freq: Every day | ORAL | Status: DC
  Administered 2021-09-25: 2 via ORAL
  Filled 2021-09-24: qty 2

## 2021-09-24 MED ORDER — TETANUS-DIPHTH-ACELL PERTUSSIS 5-2.5-18.5 LF-MCG/0.5 IM SUSY: 0.5000 mL | PREFILLED_SYRINGE | Freq: Once | INTRAMUSCULAR | Status: DC

## 2021-09-24 MED ORDER — TRANEXAMIC ACID-NACL 1000-0.7 MG/100ML-% IV SOLN: 1000.0000 mg | Freq: Once | INTRAVENOUS | Status: AC

## 2021-09-24 MED ORDER — ZOLPIDEM TARTRATE 5 MG PO TABS: 5.0000 mg | ORAL_TABLET | Freq: Every evening | ORAL | Status: DC | PRN

## 2021-09-24 MED ORDER — ONDANSETRON HCL 4 MG/2ML IJ SOLN: 4.0000 mg | INTRAMUSCULAR | Status: DC | PRN

## 2021-09-24 MED ORDER — ONDANSETRON HCL 4 MG PO TABS
4.0000 mg | ORAL_TABLET | ORAL | Status: DC | PRN
Start: 2021-09-24 — End: 2021-09-25

## 2021-09-24 MED ORDER — IBUPROFEN 600 MG PO TABS
600.0000 mg | ORAL_TABLET | Freq: Four times a day (QID) | ORAL | Status: DC
  Administered 2021-09-24 – 2021-09-25 (×6): 600 mg via ORAL
  Filled 2021-09-24 (×6): qty 1

## 2021-09-24 MED ORDER — PRENATAL MULTIVITAMIN CH
1.0000 | ORAL_TABLET | Freq: Every day | ORAL | Status: DC
  Administered 2021-09-24 – 2021-09-25 (×2): 1 via ORAL
  Filled 2021-09-24 (×2): qty 1

## 2021-09-24 MED ORDER — TRANEXAMIC ACID-NACL 1000-0.7 MG/100ML-% IV SOLN
INTRAVENOUS | Status: AC
  Administered 2021-09-24: 1000 mg via INTRAVENOUS
  Filled 2021-09-24: qty 100

## 2021-09-24 MED ORDER — DIPHENHYDRAMINE HCL 25 MG PO CAPS
25.0000 mg | ORAL_CAPSULE | Freq: Four times a day (QID) | ORAL | Status: DC | PRN
Start: 2021-09-24 — End: 2021-09-25

## 2021-09-24 MED ORDER — ACETAMINOPHEN 325 MG PO TABS
650.0000 mg | ORAL_TABLET | ORAL | Status: DC | PRN
  Administered 2021-09-24: 650 mg via ORAL
  Filled 2021-09-24 (×4): qty 2

## 2021-09-24 MED ORDER — DIBUCAINE (PERIANAL) 1 % EX OINT: 1.0000 "application " | TOPICAL_OINTMENT | CUTANEOUS | Status: DC | PRN

## 2021-09-24 NOTE — Progress Notes (Signed)
Post Partum Day 1 She had a headache and it resolved.  No CP or SOB ?Subjective: ?up ad lib, voiding, and tolerating PO ? ?Objective: ?Blood pressure 127/80, pulse 69, temperature 97.8 ?F (36.6 ?C), temperature source Oral, resp. rate 19, height 5\' 3"  (1.6 m), weight 92.5 kg, last menstrual period 12/25/2020, SpO2 100 %, unknown if currently breastfeeding. ? ?Physical Exam:  ?General: alert and cooperative ?Lochia: appropriate ?Uterine Fundus: firm ?Incision: na ?DVT Evaluation: Negative Homan's sign. ? ?Recent Labs  ?  09/24/21 ?0159 09/24/21 ?09/26/21  ?HGB 10.1* 9.9*  ?HCT 30.5* 29.3*  ? ? ?Assessment/Plan: ?Plan for discharge tomorrow and Contraception pt is unsure ?Will start procardia once magnesium is dcd.  1445 ?BP check at home in one week ? ? LOS: 1 day  ? ?Sylvia Chambers A Nolon Yellin ?09/24/2021, 2:06 PM  ? ? ?

## 2021-09-24 NOTE — Anesthesia Postprocedure Evaluation (Signed)
Anesthesia Post Note ? ?Patient: Sylvia Chambers ? ?Procedure(s) Performed: AN AD HOC LABOR EPIDURAL ? ?  ? ?Patient location during evaluation: Mother Baby ?Anesthesia Type: Epidural ?Level of consciousness: awake, oriented and awake and alert ?Pain management: pain level controlled ?Vital Signs Assessment: post-procedure vital signs reviewed and stable ?Respiratory status: spontaneous breathing, respiratory function stable and nonlabored ventilation ?Cardiovascular status: stable ?Postop Assessment: no headache, adequate PO intake, able to ambulate, patient able to bend at knees, no apparent nausea or vomiting and no backache ?Anesthetic complications: no ? ? ?No notable events documented. ? ?Last Vitals:  ?Vitals:  ? 09/24/21 1707 09/24/21 1754  ?BP:    ?Pulse:    ?Resp: 16 16  ?Temp:    ?SpO2:    ?  ?Last Pain:  ?Vitals:  ? 09/24/21 1754  ?TempSrc:   ?PainSc: 0-No pain  ? ?Pain Goal: Patients Stated Pain Goal: 0 (09/24/21 3833) ? ?  ?  ?  ?  ?  ?  ?  ? ?Sylvia Chambers ? ? ? ? ?

## 2021-09-25 ENCOUNTER — Other Ambulatory Visit (HOSPITAL_COMMUNITY): Payer: Self-pay

## 2021-09-25 LAB — GC/CHLAMYDIA PROBE AMP (~~LOC~~) NOT AT ARMC
Chlamydia: NEGATIVE
Comment: NEGATIVE
Comment: NORMAL
Neisseria Gonorrhea: NEGATIVE

## 2021-09-25 MED ORDER — IBUPROFEN 600 MG PO TABS
600.0000 mg | ORAL_TABLET | Freq: Four times a day (QID) | ORAL | 2 refills | Status: AC | PRN
  Filled 2021-09-25: qty 60, 15d supply, fill #0

## 2021-09-25 MED ORDER — NIFEDIPINE ER 30 MG PO TB24
30.0000 mg | ORAL_TABLET | Freq: Every day | ORAL | 3 refills | Status: AC
  Filled 2021-09-25: qty 30, 30d supply, fill #0

## 2021-09-25 MED ORDER — OXYCODONE-ACETAMINOPHEN 5-325 MG PO TABS
1.0000 | ORAL_TABLET | ORAL | 0 refills | Status: AC | PRN
Start: 2021-09-25 — End: ?
  Filled 2021-09-25: qty 30, 5d supply, fill #0

## 2021-09-25 NOTE — Discharge Summary (Signed)
?[facility] Discharge Summary ? ? ?Patient Name:   Sylvia Chambers ?DOB:     1995-09-23 ?MRN:     [id number] ? ?Date of Admission:   09/23/2021 ?Date of Discharge:  09/25/2021 ? ?Admitting diagnosis:   ? ?Hypertension affecting pregnancy [O16.9] ?Preeclampsia, third trimester [O14.93] ?Principal Problem: ?  Preeclampsia, third trimester ?Active Problems: ?  Hypertension affecting pregnancy ?  SVD (spontaneous vaginal delivery) ? ?   ?Discharge diagnosis:   ? ?Hypertension affecting pregnancy [O16.9] ?Preeclampsia, third trimester [O14.93] ?Principal Problem: ?  Preeclampsia, third trimester ?Active Problems: ?  Hypertension affecting pregnancy ?  SVD (spontaneous vaginal delivery) ? ?Term Pregnancy Delivered       ? ?Additional problems: none  ?                                        ?Post partum procedures: none ?Augmentation: Pitocin ?Complications: None ? ?Hospital course: Induction of Labor With Vaginal Delivery   ?26 y.o. yo G3P3003 at 32w0dwas admitted to the hospital 09/23/2021 for induction of labor.  Indication for induction: Preeclampsia.  Patient had an uncomplicated labor course as follows: ?Membrane Rupture Time/Date: 8:10 PM ,09/23/2021   ?Delivery Method:Vaginal, Spontaneous  ?Episiotomy: None  ?Lacerations:  None  ?Details of delivery can be found in separate delivery note.  Patient had a routine postpartum course. Patient is discharged home 09/25/21. ? ?Newborn Data: ?Birth date:09/24/2021  ?Birth time:12:46 AM  ?Gender:Female  ?Living status:Living  ?Apgars:9 ,9  ?Weight:3070 g  ? ?Magnesium Sulfate received: Yes: Seizure prophylaxis ?BMZ received: No ?Rhophylac:No ?MMR:No ?T-DaP:Given postpartum ?Flu: No ?Transfusion:No ?                                                              ?Type of Delivery:  SVD ?Delivering Provider: DRenard Matter ?Date of Delivery:  09/23/21 ? ?Newborn Data:  ?Baby Feeding:   Bottle and Breast ?Disposition:   home with mother ? ?Physical Exam: ?  ?Vitals:  ?  09/24/21 2300 09/24/21 2353 09/25/21 0414 09/25/21 0715  ?BP:  (!) 132/92 (!) 137/94 (!) 142/92  ?Pulse:  72 78 73  ?Resp: 15 16 17 16   ?Temp:  98 ?F (36.7 ?C) 98.2 ?F (36.8 ?C) 98.2 ?F (36.8 ?C)  ?TempSrc:  Oral Oral Oral  ?SpO2:  100% 100% 100%  ?Weight:      ?Height:      ? ?General: alert, cooperative, and no distress ?Lochia: appropriate ?Uterine Fundus: firm ?Incision: N/A ?DVT Evaluation: No evidence of DVT seen on physical exam. ?Negative Homan's sign. ?No cords or calf tenderness. ? ?Labs: ?Lab Results  ?Component Value Date  ? WBC 8.2 09/24/2021  ? HGB 9.9 (L) 09/24/2021  ? HCT 29.3 (L) 09/24/2021  ? MCV 84.9 09/24/2021  ? PLT 225 09/24/2021  ? ? ?  Latest Ref Rng & Units 09/24/2021  ?  5:17 AM  ?CMP  ?Glucose 70 - 99 mg/dL 86    ?BUN 6 - 20 mg/dL <5    ?Creatinine 0.44 - 1.00 mg/dL 0.59    ?Sodium 135 - 145 mmol/L 135    ?Potassium 3.5 - 5.1 mmol/L 2.8    ?Chloride 98 - 111  mmol/L 106    ?CO2 22 - 32 mmol/L 24    ?Calcium 8.9 - 10.3 mg/dL 7.4    ?Total Protein 6.5 - 8.1 g/dL 6.2    ?Total Bilirubin 0.3 - 1.2 mg/dL 0.4    ?Alkaline Phos 38 - 126 U/L 94    ?AST 15 - 41 U/L 16    ?ALT 0 - 44 U/L 7    ? ? ?Discharge instruction: per After Visit Summary and "Baby and Me Booklet". ? ?After Visit Meds:  ?Allergies as of 09/25/2021   ?No Known Allergies ?  ? ?  ?Medication List  ?  ? ?STOP taking these medications   ? ?predniSONE 20 MG tablet ?Commonly known as: DELTASONE ?  ? ?  ? ?TAKE these medications   ? ?cyclobenzaprine 5 MG tablet ?Commonly known as: FLEXERIL ?Take 1 tablet (5 mg total) by mouth 3 (three) times daily as needed for muscle spasms. Do not drink alcohol or drive while taking this medication.  May cause drowsiness. ?  ?ibuprofen 600 MG tablet ?Commonly known as: ADVIL ?Take 1 tablet (600 mg total) by mouth every 6 (six) hours as needed for cramping. ?  ?methocarbamol 500 MG tablet ?Commonly known as: ROBAXIN ?Take 1 tablet (500 mg total) by mouth 2 (two) times daily. ?  ?NIFEdipine 30 MG 24 hr  tablet ?Commonly known as: ADALAT CC ?Take 1 tablet (30 mg total) by mouth daily. ?What changed: Another medication with the same name was added. Make sure you understand how and when to take each. ?  ?NIFEdipine 30 MG 24 hr tablet ?Commonly known as: ADALAT CC ?Take 1 tablet (30 mg total) by mouth daily. ?Start taking on: September 26, 2021 ?What changed: You were already taking a medication with the same name, and this prescription was added. Make sure you understand how and when to take each. ?  ?oxyCODONE-acetaminophen 5-325 MG tablet ?Commonly known as: PERCOCET/ROXICET ?Take 1 tablet by mouth every 4 (four) hours as needed (for pain scale greater than or equal to 4 and less than 7). ?  ?Prenatal Complete 14-0.4 MG Tabs ?Take 1 tablet by mouth daily. ?  ? ?  ? ? ?Diet: routine diet and low salt diet ? ?Activity: Advance as tolerated. Pelvic rest for 6 weeks.  ? ?Outpatient follow up:1 week ?Follow up Appt:No future appointments. ?Follow up visit: No follow-ups on file. ? ?Postpartum contraception: Not Discussed ? ?09/25/2021 ?Sanjuana Kava, MD ? ?  ?

## 2021-09-25 NOTE — Plan of Care (Signed)
?Problem: Education: ?Goal: Knowledge of Childbirth will improve ?Outcome: Completed/Met ?Goal: Ability to make informed decisions regarding treatment and plan of care will improve ?Outcome: Completed/Met ?Goal: Ability to state and carry out methods to decrease the pain will improve ?Outcome: Completed/Met ?Goal: Individualized Educational Video(s) ?Outcome: Completed/Met ?  ?Problem: Coping: ?Goal: Ability to verbalize concerns and feelings about labor and delivery will improve ?Outcome: Completed/Met ?  ?Problem: Life Cycle: ?Goal: Ability to make normal progression through stages of labor will improve ?Outcome: Completed/Met ?Goal: Ability to effectively push during vaginal delivery will improve ?Outcome: Completed/Met ?  ?Problem: Role Relationship: ?Goal: Will demonstrate positive interactions with the child ?Outcome: Completed/Met ?  ?Problem: Safety: ?Goal: Risk of complications during labor and delivery will decrease ?Outcome: Completed/Met ?  ?Problem: Pain Management: ?Goal: Relief or control of pain from uterine contractions will improve ?Outcome: Completed/Met ?  ?Problem: Education: ?Goal: Knowledge of General Education information will improve ?Description: Including pain rating scale, medication(s)/side effects and non-pharmacologic comfort measures ?Outcome: Completed/Met ?  ?Problem: Health Behavior/Discharge Planning: ?Goal: Ability to manage health-related needs will improve ?Outcome: Completed/Met ?  ?Problem: Clinical Measurements: ?Goal: Ability to maintain clinical measurements within normal limits will improve ?Outcome: Completed/Met ?Goal: Will remain free from infection ?Outcome: Completed/Met ?Goal: Diagnostic test results will improve ?Outcome: Completed/Met ?Goal: Respiratory complications will improve ?Outcome: Completed/Met ?Goal: Cardiovascular complication will be avoided ?Outcome: Completed/Met ?  ?Problem: Activity: ?Goal: Risk for activity intolerance will decrease ?Outcome:  Completed/Met ?  ?Problem: Nutrition: ?Goal: Adequate nutrition will be maintained ?Outcome: Completed/Met ?  ?Problem: Coping: ?Goal: Level of anxiety will decrease ?Outcome: Completed/Met ?  ?Problem: Elimination: ?Goal: Will not experience complications related to bowel motility ?Outcome: Completed/Met ?Goal: Will not experience complications related to urinary retention ?Outcome: Completed/Met ?  ?Problem: Pain Managment: ?Goal: General experience of comfort will improve ?Outcome: Completed/Met ?  ?Problem: Safety: ?Goal: Ability to remain free from injury will improve ?Outcome: Completed/Met ?  ?Problem: Skin Integrity: ?Goal: Risk for impaired skin integrity will decrease ?Outcome: Completed/Met ?  ?Problem: Education: ?Goal: Knowledge of Childbirth will improve ?Outcome: Completed/Met ?Goal: Ability to make informed decisions regarding treatment and plan of care will improve ?Outcome: Completed/Met ?Goal: Ability to state and carry out methods to decrease the pain will improve ?Outcome: Completed/Met ?Goal: Individualized Educational Video(s) ?Outcome: Completed/Met ?  ?Problem: Coping: ?Goal: Ability to verbalize concerns and feelings about labor and delivery will improve ?Outcome: Completed/Met ?  ?Problem: Life Cycle: ?Goal: Ability to make normal progression through stages of labor will improve ?Outcome: Completed/Met ?Goal: Ability to effectively push during vaginal delivery will improve ?Outcome: Completed/Met ?  ?Problem: Role Relationship: ?Goal: Will demonstrate positive interactions with the child ?Outcome: Completed/Met ?  ?Problem: Safety: ?Goal: Risk of complications during labor and delivery will decrease ?Outcome: Completed/Met ?  ?Problem: Pain Management: ?Goal: Relief or control of pain from uterine contractions will improve ?Outcome: Completed/Met ?  ?Problem: Education: ?Goal: Knowledge of condition will improve ?Outcome: Completed/Met ?Goal: Individualized Educational Video(s) ?Outcome:  Completed/Met ?Goal: Individualized Newborn Educational Video(s) ?Outcome: Completed/Met ?  ?Problem: Activity: ?Goal: Will verbalize the importance of balancing activity with adequate rest periods ?Outcome: Completed/Met ?Goal: Ability to tolerate increased activity will improve ?Outcome: Completed/Met ?  ?Problem: Coping: ?Goal: Ability to identify and utilize available resources and services will improve ?Outcome: Completed/Met ?  ?Problem: Life Cycle: ?Goal: Chance of risk for complications during the postpartum period will decrease ?Outcome: Completed/Met ?  ?Problem: Role Relationship: ?Goal: Ability to demonstrate positive interaction with newborn will improve ?  Outcome: Completed/Met ?  ?Problem: Skin Integrity: ?Goal: Demonstration of wound healing without infection will improve ?Outcome: Completed/Met ?  ?

## 2021-10-02 ENCOUNTER — Telehealth (HOSPITAL_COMMUNITY): Payer: Self-pay | Admitting: *Deleted

## 2021-10-02 NOTE — Telephone Encounter (Signed)
Attempted Hospital Discharge Follow-Up Call.  Left voice mail requesting that patient return RN's phone call.  

## 2021-11-19 ENCOUNTER — Other Ambulatory Visit: Payer: Self-pay

## 2021-11-19 ENCOUNTER — Ambulatory Visit (HOSPITAL_COMMUNITY)
Admission: EM | Admit: 2021-11-19 | Discharge: 2021-11-19 | Disposition: A | Discharge status: Home / Self Care | Payer: Medicaid Other | Attending: Physician Assistant | Admitting: Physician Assistant

## 2021-11-19 ENCOUNTER — Encounter (HOSPITAL_COMMUNITY): Payer: Self-pay | Admitting: Emergency Medicine

## 2021-11-19 DIAGNOSIS — G9331 Postviral fatigue syndrome: Secondary | ICD-10-CM | POA: Diagnosis not present

## 2021-11-19 DIAGNOSIS — J020 Streptococcal pharyngitis: Secondary | ICD-10-CM

## 2021-11-19 LAB — POCT RAPID STREP A, ED / UC: Streptococcus, Group A Screen (Direct): POSITIVE — AB

## 2021-11-19 MED ORDER — AMOXICILLIN 500 MG PO CAPS: 500.0000 mg | ORAL_CAPSULE | Freq: Three times a day (TID) | ORAL | 0 refills | Status: AC

## 2021-11-19 NOTE — ED Triage Notes (Signed)
Sore throat started 3 days ago.  Denies fever, headache, cough.  Patient reports taking ibuprofen

## 2021-11-19 NOTE — Discharge Instructions (Signed)
Advise to use salt water gargles frequently to help reduce the pain of the sore throat and the swelling. Advised to use lozenges to help reduce this discomfort of the sore throat. Advised to use ibuprofen 6 to 800 mg every 8 hours to help reduce the pain. Advised to follow-up PCP or return to urgent care if symptoms fail to improve.

## 2021-11-19 NOTE — ED Provider Notes (Signed)
[facility]    CSN: [id number] Arrival date & time: 11/19/21  1243      History   Chief Complaint Chief Complaint  Patient presents with   Sore Throat    HPI Sylvia Chambers is a 26 y.o. female.   26 year old female presents with sore throat.  Patient relates for the past 1 to 2 days she has been having increasing sore throat, painful swallowing.  Patient relates that last night she ate some hot chips, and then after started having increased sore throat that felt like her throat was closing up.  Patient relates that this lasted for several hours before it improved.  Patient continues today with sore throat, and painful swallowing.  Patient relates she is not having any fever, chills, no upper respiratory symptoms or sinus congestion.  Patient relates she is not having any chest congestion or cough.  Patient denies recent travel, and relates she has not been around any friends or family members that have been sick.  No shortness of breath or difficulty breathing.   Sore Throat    Past Medical History:  Diagnosis Date   Asthma    Infection    UTI   Pregnancy induced hypertension     Patient Active Problem List   Diagnosis Date Noted   SVD (spontaneous vaginal delivery) 09/24/2021   Hypertension affecting pregnancy 09/23/2021   Preeclampsia, third trimester 09/23/2021    Past Surgical History:  Procedure Laterality Date   EYE SURGERY      OB History     Gravida  3   Para  3   Term  3   Preterm      AB      Living  3      SAB      IAB      Ectopic      Multiple  0   Live Births  3            Home Medications    Prior to Admission medications   Medication Sig Start Date End Date Taking? Authorizing Provider  amoxicillin (AMOXIL) 500 MG capsule Take 1 capsule (500 mg total) by mouth 3 (three) times daily. 11/19/21  Yes Ellsworth Lennox, PA-C  cyclobenzaprine (FLEXERIL) 5 MG tablet Take 1 tablet (5 mg total) by mouth 3 (three) times  daily as needed for muscle spasms. Do not drink alcohol or drive while taking this medication.  May cause drowsiness. Patient not taking: Reported on 08/10/2021 11/27/20   Particia Nearing, PA-C  ibuprofen (ADVIL) 600 MG tablet Take 1 tablet (600 mg total) by mouth every 6 (six) hours as needed for cramping. 09/25/21   Essie Hart, MD  methocarbamol (ROBAXIN) 500 MG tablet Take 1 tablet (500 mg total) by mouth 2 (two) times daily. Patient not taking: Reported on 07/13/2021 12/14/20   Dietrich Pates, PA-C  NIFEdipine (ADALAT CC) 30 MG 24 hr tablet Take 1 tablet (30 mg total) by mouth daily. Patient not taking: Reported on 07/13/2021 04/23/19   Dale Elkmont, FNP  NIFEdipine (ADALAT CC) 30 MG 24 hr tablet Take 1 tablet (30 mg total) by mouth daily. Patient not taking: Reported on 11/19/2021 09/26/21   Essie Hart, MD  oxyCODONE-acetaminophen (PERCOCET/ROXICET) 5-325 MG tablet Take 1 tablet by mouth every 4 (four) hours as needed (for pain scale greater than or equal to 4 and less than 7). Patient not taking: Reported on 11/19/2021 09/25/21   Essie Hart, MD  Prenatal Vit-Fe Fumarate-FA (PRENATAL  COMPLETE) 14-0.4 MG TABS Take 1 tablet by mouth daily. Patient not taking: Reported on 01/22/2019 09/10/18   Dartha Lodge, PA-C    Family History Family History  Problem Relation Age of Onset   Hypertension Mother    Asthma Mother    Healthy Father     Social History Social History   Tobacco Use   Smoking status: Former    Types: Cigars    Quit date: 11/22/2018    Years since quitting: 2.9   Smokeless tobacco: Never   Tobacco comments:    black and mild 2/day  Vaping Use   Vaping Use: Never used  Substance Use Topics   Alcohol use: No   Drug use: Not Currently    Types: Marijuana     Allergies   Patient has no known allergies.   Review of Systems Review of Systems  HENT:  Positive for sore throat.      Physical Exam Triage Vital Signs ED Triage Vitals  Enc Vitals Group     BP  11/19/21 1358 120/84     Pulse Rate 11/19/21 1358 82     Resp 11/19/21 1358 18     Temp 11/19/21 1358 98.7 F (37.1 C)     Temp Source 11/19/21 1358 Oral     SpO2 11/19/21 1358 98 %     Weight --      Height --      Head Circumference --      Peak Flow --      Pain Score 11/19/21 1355 8     Pain Loc --      Pain Edu? --      Excl. in GC? --    No data found.  Updated Vital Signs BP 120/84 (BP Location: Left Arm)   Pulse 82   Temp 98.7 F (37.1 C) (Oral)   Resp 18   LMP 10/25/2021   SpO2 98%   Visual Acuity Right Eye Distance:   Left Eye Distance:   Bilateral Distance:    Right Eye Near:   Left Eye Near:    Bilateral Near:     Physical Exam Constitutional:      Appearance: She is well-developed.  HENT:     Right Ear: Tympanic membrane and ear canal normal.     Left Ear: Tympanic membrane and ear canal normal.     Mouth/Throat:     Mouth: Mucous membranes are moist.     Pharynx: Posterior oropharyngeal erythema present. No pharyngeal swelling or oropharyngeal exudate.     Comments: Pharynx: There is moderate swelling and redness of the tonsils, they are almost touching the uvula.  No exudate is noted. Cardiovascular:     Rate and Rhythm: Normal rate and regular rhythm.     Heart sounds: Normal heart sounds.  Pulmonary:     Effort: Pulmonary effort is normal.     Breath sounds: Normal breath sounds and air entry. No wheezing, rhonchi or rales.  Lymphadenopathy:     Cervical: No cervical adenopathy.  Neurological:     Mental Status: She is alert.      UC Treatments / Results  Labs (all labs ordered are listed, but only abnormal results are displayed) Labs Reviewed  POCT RAPID STREP A, ED / UC - Abnormal; Notable for the following components:      Result Value   Streptococcus, Group A Screen (Direct) POSITIVE (*)    All other components within normal limits    EKG  Radiology No results found.  Procedures Procedures (including critical care  time)  Medications Ordered in UC Medications - No data to display  Initial Impression / Assessment and Plan / UC Course  I have reviewed the triage vital signs and the nursing notes.  Pertinent labs & imaging results that were available during my care of the patient were reviewed by me and considered in my medical decision making (see chart for details).    Plan: 1.  Advised patient to use salt water gargles frequently to help reduce the pain of the sore throat. 2.  Advised patient to take ibuprofen 800 mg 1 every 8 hours with food to help reduce the throat pain. 3.  Advised patient to take the amoxicillin 500 mg 1 every 8 hours until completed. 4.  Advised patient to follow-up with PCP or return to urgent care if symptoms fail to improve. Final Clinical Impressions(s) / UC Diagnoses   Final diagnoses:  Pharyngitis due to Streptococcus species  Postviral fatigue syndrome     Discharge Instructions      Advise to use salt water gargles frequently to help reduce the pain of the sore throat and the swelling. Advised to use lozenges to help reduce this discomfort of the sore throat. Advised to use ibuprofen 6 to 800 mg every 8 hours to help reduce the pain. Advised to follow-up PCP or return to urgent care if symptoms fail to improve.    ED Prescriptions     Medication Sig Dispense Auth. Provider   amoxicillin (AMOXIL) 500 MG capsule Take 1 capsule (500 mg total) by mouth 3 (three) times daily. 21 capsule Ellsworth Lennox, PA-C      PDMP not reviewed this encounter.   Ellsworth Lennox, PA-C 11/19/21 1524
